# Patient Record
Sex: Female | Born: 1950 | Race: White | Hispanic: No | Smoking: Never smoker
Health system: Southern US, Community
[De-identification: ages and names within clinical notes are randomized; demographics above are authoritative.]

## PROBLEM LIST (undated history)

## (undated) DIAGNOSIS — M1990 Unspecified osteoarthritis, unspecified site: Secondary | ICD-10-CM

## (undated) DIAGNOSIS — F32A Depression, unspecified: Secondary | ICD-10-CM

## (undated) DIAGNOSIS — I10 Essential (primary) hypertension: Secondary | ICD-10-CM

## (undated) DIAGNOSIS — K2270 Barrett's esophagus without dysplasia: Secondary | ICD-10-CM

## (undated) DIAGNOSIS — E119 Type 2 diabetes mellitus without complications: Secondary | ICD-10-CM

## (undated) DIAGNOSIS — K219 Gastro-esophageal reflux disease without esophagitis: Secondary | ICD-10-CM

## (undated) DIAGNOSIS — F419 Anxiety disorder, unspecified: Secondary | ICD-10-CM

## (undated) HISTORY — PX: TONSILLECTOMY: SUR1361

## (undated) HISTORY — PX: ABDOMINAL HYSTERECTOMY: SHX81

## (undated) HISTORY — PX: KNEE SURGERY: SHX244

---

## 2021-07-02 ENCOUNTER — Other Ambulatory Visit: Payer: Self-pay | Admitting: Neurosurgery

## 2021-07-02 DIAGNOSIS — M8580 Other specified disorders of bone density and structure, unspecified site: Secondary | ICD-10-CM

## 2021-07-15 ENCOUNTER — Ambulatory Visit
Admission: RE | Admit: 2021-07-15 | Discharge: 2021-07-15 | Disposition: A | Discharge status: Home / Self Care | Payer: Medicare Other | Source: Ambulatory Visit | Attending: Neurosurgery | Admitting: Neurosurgery

## 2021-07-15 ENCOUNTER — Other Ambulatory Visit: Payer: Self-pay

## 2021-07-15 DIAGNOSIS — M8580 Other specified disorders of bone density and structure, unspecified site: Secondary | ICD-10-CM

## 2021-07-24 ENCOUNTER — Other Ambulatory Visit: Payer: Self-pay | Admitting: Neurosurgery

## 2021-08-15 NOTE — Progress Notes (Signed)
Surgical Instructions    Your procedure is scheduled on Tuesday September 13th.  Report to [facility] Main Entrance "A" at 8 A.M., then check in with the Admitting office.  Call this number if you have problems the morning of surgery:  [phone]   If you have any questions prior to your surgery date call [phone]: Open Monday-Friday 8am-4pm    Remember:  Do not eat after midnight the night before your surgery  You may drink clear liquids until 7am the morning of your surgery.   Clear liquids allowed are: Water, Non-Citrus Juices (without pulp), Carbonated Beverages, Clear Tea, Black Coffee Only, and Gatorade    Take these medicines the morning of surgery with A SIP OF WATER  escitalopram (LEXAPRO) 10 MG tablet omeprazole (PRILOSEC) 20 MG capsule rosuvastatin (CRESTOR) 5 MG tablet  IF NEEDED HYDROcodone-acetaminophen (NORCO/VICODIN) 5-325 MG tablet    As of today, STOP taking any Aspirin (unless otherwise instructed by your surgeon) Voltaren gel, Aleve, Naproxen, Ibuprofen, Motrin, Advil, Goody's, BC's, all herbal medications, fish oil, and all vitamins.   WHAT DO I DO ABOUT MY DIABETES MEDICATION?   Do not take oral diabetes medicines (METFORMIN) the morning of surgery.  HOW TO MANAGE YOUR DIABETES BEFORE AND AFTER SURGERY  Why is it important to control my blood sugar before and after surgery? Improving blood sugar levels before and after surgery helps healing and can limit problems. A way of improving blood sugar control is eating a healthy diet by:  Eating less sugar and carbohydrates  Increasing activity/exercise  Talking with your doctor about reaching your blood sugar goals High blood sugars (greater than 180 mg/dL) can raise your risk of infections and slow your recovery, so you will need to focus on controlling your diabetes during the weeks before surgery. Make sure that the doctor who takes care of your diabetes knows about your planned surgery including  the date and location.  How do I manage my blood sugar before surgery? Check your blood sugar at least 4 times a day, starting 2 days before surgery, to make sure that the level is not too high or low.  Check your blood sugar the morning of your surgery when you wake up and every 2 hours until you get to the Short Stay unit.  If your blood sugar is less than 70 mg/dL, you will need to treat for low blood sugar: Do not take insulin. Treat a low blood sugar (less than 70 mg/dL) with  cup of clear juice (cranberry or apple), 4 glucose tablets, OR glucose gel. Recheck blood sugar in 15 minutes after treatment (to make sure it is greater than 70 mg/dL). If your blood sugar is not greater than 70 mg/dL on recheck, call [phone] for further instructions. Report your blood sugar to the short stay nurse when you get to Short Stay.  If you are admitted to the hospital after surgery: Your blood sugar will be checked by the staff and you will probably be given insulin after surgery (instead of oral diabetes medicines) to make sure you have good blood sugar levels. The goal for blood sugar control after surgery is 80-180 mg/dL.        Do not wear jewelry or makeup Do not wear lotions, powders, perfumes, or deodorant. Do not shave 48 hours prior to surgery.   Do not bring valuables to the hospital. DO Not wear nail polish, gel polish, artificial nails, or any other type of covering on  natural nails including  finger and toenails. If patients have artificial nails, gel coating, etc. that need to be removed by a nail salon please have this removed prior to surgery or surgery may need to be canceled/delayed if the surgeon/ anesthesia feels like the patient is unable to be adequately monitored.             [facility] is not responsible for any belongings or valuables.  Do NOT Smoke (Tobacco/Vaping) or drink Alcohol 24 hours prior to your procedure If you use a CPAP at night, you may bring all  equipment for your overnight stay.   Contacts, glasses, dentures or bridgework may not be worn into surgery, please bring cases for these belongings   For patients admitted to the hospital, discharge time will be determined by your treatment team.   Patients discharged the day of surgery will not be allowed to drive home, and someone needs to stay with them for 24 hours.  ONLY 1 SUPPORT PERSON MAY BE PRESENT WHILE YOU ARE IN SURGERY. IF YOU ARE TO BE ADMITTED ONCE YOU ARE IN YOUR ROOM YOU WILL BE ALLOWED TWO (2) VISITORS.  Minor children may have two parents present. Special consideration for safety and communication needs will be reviewed on a case by case basis.  Special instructions:    Oral Hygiene is also important to reduce your risk of infection.  Remember - BRUSH YOUR TEETH THE MORNING OF SURGERY WITH YOUR REGULAR TOOTHPASTE   [facility]- Preparing For Surgery  Before surgery, you can play an important role. Because skin is not sterile, your skin needs to be as free of germs as possible. You can reduce the number of germs on your skin by washing with CHG (chlorahexidine gluconate) Soap before surgery.  CHG is an antiseptic cleaner which kills germs and bonds with the skin to continue killing germs even after washing.     Please do not use if you have an allergy to CHG or antibacterial soaps. If your skin becomes reddened/irritated stop using the CHG.  Do not shave (including legs and underarms) for at least 48 hours prior to first CHG shower. It is OK to shave your face.  Please follow these instructions carefully.     Shower the NIGHT BEFORE SURGERY and the MORNING OF SURGERY with CHG Soap.   If you chose to wash your hair, wash your hair first as usual with your normal shampoo. After you shampoo, rinse your hair and body thoroughly to remove the shampoo.  Then [employer] and genitals (private parts) with your normal soap and rinse thoroughly to remove soap.  After that Use CHG  Soap as you would any other liquid soap. You can apply CHG directly to the skin and wash gently with a scrungie or a clean washcloth.   Apply the CHG Soap to your body ONLY FROM THE NECK DOWN.  Do not use on open wounds or open sores. Avoid contact with your eyes, ears, mouth and genitals (private parts). Wash Face and genitals (private parts)  with your normal soap.   Wash thoroughly, paying special attention to the area where your surgery will be performed.  Thoroughly rinse your body with warm water from the neck down.  DO NOT shower/wash with your normal soap after using and rinsing off the CHG Soap.  Pat yourself dry with a CLEAN TOWEL.  Wear CLEAN PAJAMAS to bed the night before surgery  Place CLEAN SHEETS on your bed the night before your surgery  DO  NOT SLEEP WITH PETS.   Day of Surgery:  Take a shower with CHG soap. Wear Clean/Comfortable clothing the morning of surgery Do not apply any deodorants/lotions.   Remember to brush your teeth WITH YOUR REGULAR TOOTHPASTE.   Please read over the following fact sheets that you were given.

## 2021-08-18 ENCOUNTER — Encounter (HOSPITAL_COMMUNITY): Payer: Self-pay

## 2021-08-18 ENCOUNTER — Encounter (HOSPITAL_COMMUNITY)
Admission: RE | Admit: 2021-08-18 | Discharge: 2021-08-18 | Disposition: A | Discharge status: Home / Self Care | Payer: Medicare Other | Source: Ambulatory Visit | Attending: Neurosurgery | Admitting: Neurosurgery

## 2021-08-18 ENCOUNTER — Other Ambulatory Visit: Payer: Self-pay

## 2021-08-18 DIAGNOSIS — Z01818 Encounter for other preprocedural examination: Secondary | ICD-10-CM | POA: Diagnosis present

## 2021-08-18 HISTORY — DX: Unspecified osteoarthritis, unspecified site: M19.90

## 2021-08-18 HISTORY — DX: Essential (primary) hypertension: I10

## 2021-08-18 HISTORY — DX: Gastro-esophageal reflux disease without esophagitis: K21.9

## 2021-08-18 HISTORY — DX: Hemochromatosis, unspecified: E83.119

## 2021-08-18 HISTORY — DX: Barrett's esophagus without dysplasia: K22.70

## 2021-08-18 HISTORY — DX: Anxiety disorder, unspecified: F41.9

## 2021-08-18 HISTORY — DX: Type 2 diabetes mellitus without complications: E11.9

## 2021-08-18 HISTORY — DX: Depression, unspecified: F32.A

## 2021-08-18 LAB — BASIC METABOLIC PANEL
Anion gap: 9 (ref 5–15)
BUN: 12 mg/dL (ref 8–23)
CO2: 28 mmol/L (ref 22–32)
Calcium: 9.6 mg/dL (ref 8.9–10.3)
Chloride: 102 mmol/L (ref 98–111)
Creatinine, Ser: 0.85 mg/dL (ref 0.44–1.00)
GFR, Estimated: >60 mL/min (ref >60)
Glucose, Bld: 105 mg/dL — ABNORMAL HIGH (ref 70–99)
Potassium: 4.1 mmol/L (ref 3.5–5.1)
Sodium: 139 mmol/L (ref 135–145)

## 2021-08-18 LAB — GLUCOSE, CAPILLARY: Glucose-Capillary: 113 mg/dL — ABNORMAL HIGH (ref 70–99)

## 2021-08-18 LAB — CBC
HCT: 35.2 % — ABNORMAL LOW (ref 36.0–46.0)
Hemoglobin: 11.2 g/dL — ABNORMAL LOW (ref 12.0–15.0)
MCH: 26.2 pg (ref 26.0–34.0)
MCHC: 31.8 g/dL (ref 30.0–36.0)
MCV: 82.2 fL (ref 80.0–100.0)
Platelets: 198 10*3/uL (ref 150–400)
RBC: 4.28 MIL/uL (ref 3.87–5.11)
RDW: 15.4 % (ref 11.5–15.5)
WBC: 7.3 10*3/uL (ref 4.0–10.5)
nRBC: 0 % (ref 0.0–0.2)

## 2021-08-18 LAB — TYPE AND SCREEN
ABO/RH(D): O POS
Antibody Screen: NEGATIVE

## 2021-08-18 LAB — HEMOGLOBIN A1C
Hgb A1c MFr Bld: 6 % — ABNORMAL HIGH (ref 4.8–5.6)
Mean Plasma Glucose: 125.5 mg/dL

## 2021-08-18 LAB — SURGICAL PCR SCREEN
MRSA, PCR: NEGATIVE
Staphylococcus aureus: NEGATIVE

## 2021-08-18 NOTE — Progress Notes (Signed)
PCP - Dr. Jeannett Senior Cardiologist - Dr. Maud Deed not seen in over 15 years. Pt was consulted at one point for cardiac testing d/t abnormal EKG. Testing was benign and pt has not had any recurrent cardiac issues.    Chest x-ray - n/a EKG - 08/18/21 Stress Test - 15+ years ago ECHO - 15+ years ago Cardiac Cath - denies  Sleep Study - denies CPAP - denies  Fasting Blood Sugar - 90-100 Checks Blood Sugar 2 times a week CBG at PAT 113 Last A1C reported 04/2021-5.7 Will collect A1C today.  Blood Thinner Instructions: n/a Aspirin Instructions: n/a  ERAS Protcol -Pt to stop clears by 0700 DOS. PRE-SURGERY Ensure or G2- not indicated.  COVID TEST- Pt to go for covid testing on 09/05/21 at [facility]. Pt given map, directions, instructions and requisition form.   Anesthesia review: Yes, pending PCP last office note.  Patient denies shortness of breath, fever, cough and chest pain at PAT appointment   All instructions explained to the patient, with a verbal understanding of the material. Patient agrees to go over the instructions while at home for a better understanding. Patient also instructed to self quarantine after being tested for COVID-19. The opportunity to ask questions was provided.

## 2021-08-20 NOTE — Progress Notes (Signed)
Anesthesia Chart Review:  Case: [id number] Date/Time: 09/09/21 0945   Procedures:      Right Lumbar 3-4 Lumbar 4-5 Anterolateral lumbar interbody fusion with percutaneous pedicle screw fixation (Right)     LUMBAR PERCUTANEOUS PEDICLE SCREW 2 LEVEL     APPLICATION OF PULSE     APPLICATION OF CELL SAVER   Anesthesia type: General   Pre-op diagnosis: Lumbar foraminal stenosis   Location: [facility] OR ROOM 21 / [facility] OR   Surgeons: Maeola Harman, MD       DISCUSSION: Patient is a 70 year old female scheduled for the above procedure.  History includes never smoker, HTN, DM2, GERD, hemochromatosis.   Dr. Jeannett Senior signed a note of surgical clearance classifying her as "low risk".  She included office visit from 06/10/2021 for follow-up hypertension, DM2, hypercholesterolemia.  A1c down to 5.8% so metformin dose reduced.  Lisinopril-HCTZ continued for hypertension.  Rosuvastatin continued at 5 mg daily for hyperlipidemia.  PAT RN requested records from [facility] (formerly [facility] Records were primarily from an April 2017 3-day admission for acute encephalopathy without clear etiology documented, although treated for low vitamin B-12 level and mild renal insufficiency with Creatinine of 1.2. Confusion and gait abnormality resolved. Unremarkable brain MRI 04/15/2016.  Neurology was consulted.  EEG showed an isolated event of left temporal asymmetric theta slowing to correlate with the possibility of cortical structural and/or physiological brain damage and/or dysfunction that could implicate some mild decrease seizure threshold, although no evidence epileptiform activity and no electrographic seizure noted. Overnight oximetry for 04/15/2016 was negative on room air. She was discharged home with out-patient primary care and neurology follow-up. No old EKG or cardiac testing received (denied any recent testing).  She denied fever, cough, SOB, chest pain at PAT RN visit. Her  preoperative COVID-19 test is scheduled for 09/05/21. Her PAT visit occurred > 14 days out from her surgery, so she will need a T&S on arrival. Anesthesia team to evaluate on the day of surgery.     VS: BP (!) 142/76   Pulse 85   Temp 36.7 C (Oral)   Resp 18   Ht 5\' 6"  (1.676 m)   Wt 70.8 kg   SpO2 99%   BMI 25.18 kg/m    PROVIDERS: , MD is PCP (GYN)   LABS: Labs reviewed: Acceptable for surgery. (all labs ordered are listed, but only abnormal results are displayed)  Labs Reviewed  GLUCOSE, CAPILLARY - Abnormal; Notable for the following components:      Result Value   Glucose-Capillary 113 (*)    All other components within normal limits  HEMOGLOBIN A1C - Abnormal; Notable for the following components:   Hgb A1c MFr Bld 6.0 (*)    All other components within normal limits  BASIC METABOLIC PANEL - Abnormal; Notable for the following components:   Glucose, Bld 105 (*)    All other components within normal limits  CBC - Abnormal; Notable for the following components:   Hemoglobin 11.2 (*)    HCT 35.2 (*)    All other components within normal limits  SURGICAL PCR SCREEN  TYPE AND SCREEN     EKG: 08/18/21: Normal sinus rhythm Septal infarct , age undetermined Abnormal ECG Confirmed by 08/20/21 (Hillis Range) on 08/18/2021 8:31:26 PM   CV: Reported normal "cardiac testing" greater than 15 years ago for evaluation of an abnormal EKG per cardiologist Dr. 08/20/2021 in [place], [place].   Past Medical History:  Diagnosis Date   Anxiety    Arthritis    Barrett's syndrome    Depression    Diabetes mellitus without complication (HCC)    GERD (gastroesophageal reflux disease)    Hemochromatosis    Hypertension     Past Surgical History:  Procedure Laterality Date   ABDOMINAL HYSTERECTOMY     KNEE SURGERY Right    TONSILLECTOMY      MEDICATIONS:  Alpha-Lipoic Acid 200 MG TABS   ALPRAZolam (XANAX) 0.5 MG tablet   Cholecalciferol (VITAMIN D) 50 MCG (2000  UT) CAPS   diclofenac Sodium (VOLTAREN) 1 % GEL   escitalopram (LEXAPRO) 10 MG tablet   HYDROcodone-acetaminophen (NORCO/VICODIN) 5-325 MG tablet   ibuprofen (ADVIL) 800 MG tablet   lisinopril-hydrochlorothiazide (ZESTORETIC) 20-12.5 MG tablet   Magnesium 250 MG TABS   metFORMIN (GLUCOPHAGE-XR) 500 MG 24 hr tablet   Omega-3 Fatty Acids (FISH OIL ULTRA) 1400 MG CAPS   omeprazole (PRILOSEC) 20 MG capsule   rosuvastatin (CRESTOR) 5 MG tablet   vitamin B-12 (CYANOCOBALAMIN) 1000 MCG tablet   No current facility-administered medications for this encounter.    Shonna Chock, PA-C Surgical Short Stay/Anesthesiology [facility] Phone [phone] [facility] Phone [phone] 08/21/2021 9:32 AM

## 2021-08-21 NOTE — Anesthesia Preprocedure Evaluation (Addendum)
Anesthesia Evaluation  Patient identified by MRN, date of birth, ID band Patient awake    Reviewed: Allergy & Precautions, NPO status , Patient's Chart, lab work & pertinent test results  Airway Mallampati: II  TM Distance: >3 FB Neck ROM: Full    Dental no notable dental hx.    Pulmonary neg pulmonary ROS,    Pulmonary exam normal breath sounds clear to auscultation       Cardiovascular hypertension, Pt. on medications and Pt. on home beta blockers Normal cardiovascular exam Rhythm:Regular Rate:Normal  ECG: NSR, rate 72   Neuro/Psych PSYCHIATRIC DISORDERS Anxiety Depression negative neurological ROS     GI/Hepatic Neg liver ROS, GERD  Medicated and Controlled,  Endo/Other  diabetes, Oral Hypoglycemic Agents  Renal/GU negative Renal ROS     Musculoskeletal  (+) Arthritis ,   Abdominal   Peds  Hematology HLD   Anesthesia Other Findings Lumbar foraminal stenosis  Reproductive/Obstetrics                           Anesthesia Physical Anesthesia Plan  ASA: 3  Anesthesia Plan: General   Post-op Pain Management:    Induction: Intravenous  PONV Risk Score and Plan: 3 and Dexamethasone, Midazolam, Ondansetron and Treatment may vary due to age or medical condition  Airway Management Planned: Oral ETT  Additional Equipment:   Intra-op Plan:   Post-operative Plan: Extubation in OR  Informed Consent: I have reviewed the patients History and Physical, chart, labs and discussed the procedure including the risks, benefits and alternatives for the proposed anesthesia with the patient or authorized representative who has indicated his/her understanding and acceptance.     Dental advisory given  Plan Discussed with: CRNA  Anesthesia Plan Comments: (Reviewed PAT note written 08/21/2021 by Shonna Chock, PA-C. [facility])      Anesthesia Quick Evaluation

## 2021-09-05 ENCOUNTER — Other Ambulatory Visit: Payer: Self-pay | Admitting: Neurosurgery

## 2021-09-06 LAB — SARS CORONAVIRUS 2 (TAT 6-24 HRS): SARS Coronavirus 2: NEGATIVE

## 2021-09-09 ENCOUNTER — Inpatient Hospital Stay (HOSPITAL_COMMUNITY): Payer: Medicare Other | Admitting: Physician Assistant

## 2021-09-09 ENCOUNTER — Inpatient Hospital Stay (HOSPITAL_COMMUNITY)
Admission: RE | Admit: 2021-09-09 | Discharge: 2021-09-10 | DRG: 458 | Disposition: A | Discharge status: Home Health | Payer: Medicare Other | Attending: Neurosurgery | Admitting: Neurosurgery

## 2021-09-09 ENCOUNTER — Inpatient Hospital Stay (HOSPITAL_COMMUNITY): Payer: Medicare Other

## 2021-09-09 ENCOUNTER — Inpatient Hospital Stay (HOSPITAL_COMMUNITY): Payer: Medicare Other | Admitting: Certified Registered"

## 2021-09-09 ENCOUNTER — Encounter (HOSPITAL_COMMUNITY): Payer: Self-pay | Admitting: Neurosurgery

## 2021-09-09 ENCOUNTER — Other Ambulatory Visit: Payer: Self-pay

## 2021-09-09 ENCOUNTER — Encounter (HOSPITAL_COMMUNITY)
Admission: RE | Disposition: A | Discharge status: Home Health | Payer: Self-pay | Source: Home / Self Care | Attending: Neurosurgery

## 2021-09-09 DIAGNOSIS — M4126 Other idiopathic scoliosis, lumbar region: Secondary | ICD-10-CM | POA: Diagnosis present

## 2021-09-09 DIAGNOSIS — M5459 Other low back pain: Secondary | ICD-10-CM | POA: Diagnosis present

## 2021-09-09 DIAGNOSIS — K219 Gastro-esophageal reflux disease without esophagitis: Secondary | ICD-10-CM | POA: Diagnosis present

## 2021-09-09 DIAGNOSIS — M810 Age-related osteoporosis without current pathological fracture: Secondary | ICD-10-CM | POA: Diagnosis present

## 2021-09-09 DIAGNOSIS — M5416 Radiculopathy, lumbar region: Secondary | ICD-10-CM | POA: Diagnosis present

## 2021-09-09 DIAGNOSIS — F32A Depression, unspecified: Secondary | ICD-10-CM | POA: Diagnosis present

## 2021-09-09 DIAGNOSIS — Z79899 Other long term (current) drug therapy: Secondary | ICD-10-CM | POA: Diagnosis not present

## 2021-09-09 DIAGNOSIS — M4316 Spondylolisthesis, lumbar region: Secondary | ICD-10-CM | POA: Diagnosis present

## 2021-09-09 DIAGNOSIS — I10 Essential (primary) hypertension: Secondary | ICD-10-CM | POA: Diagnosis present

## 2021-09-09 DIAGNOSIS — Z7984 Long term (current) use of oral hypoglycemic drugs: Secondary | ICD-10-CM | POA: Diagnosis not present

## 2021-09-09 DIAGNOSIS — F419 Anxiety disorder, unspecified: Secondary | ICD-10-CM | POA: Diagnosis present

## 2021-09-09 DIAGNOSIS — Z419 Encounter for procedure for purposes other than remedying health state, unspecified: Secondary | ICD-10-CM

## 2021-09-09 DIAGNOSIS — M48062 Spinal stenosis, lumbar region with neurogenic claudication: Secondary | ICD-10-CM | POA: Diagnosis present

## 2021-09-09 DIAGNOSIS — Z20822 Contact with and (suspected) exposure to covid-19: Secondary | ICD-10-CM | POA: Diagnosis present

## 2021-09-09 DIAGNOSIS — E119 Type 2 diabetes mellitus without complications: Secondary | ICD-10-CM | POA: Diagnosis present

## 2021-09-09 DIAGNOSIS — M419 Scoliosis, unspecified: Secondary | ICD-10-CM | POA: Diagnosis present

## 2021-09-09 HISTORY — PX: ANTERIOR LAT LUMBAR FUSION: SHX1168

## 2021-09-09 HISTORY — PX: LUMBAR PERCUTANEOUS PEDICLE SCREW 2 LEVEL: SHX5561

## 2021-09-09 LAB — TYPE AND SCREEN
ABO/RH(D): O POS
Antibody Screen: NEGATIVE

## 2021-09-09 LAB — GLUCOSE, CAPILLARY
Glucose-Capillary: 106 mg/dL — ABNORMAL HIGH (ref 70–99)
Glucose-Capillary: 137 mg/dL — ABNORMAL HIGH (ref 70–99)
Glucose-Capillary: 170 mg/dL — ABNORMAL HIGH (ref 70–99)

## 2021-09-09 SURGERY — ANTERIOR LATERAL LUMBAR FUSION 2 LEVELS
Anesthesia: General | Laterality: Right

## 2021-09-09 MED ORDER — ACETAMINOPHEN 650 MG RE SUPP: 650.0000 mg | RECTAL | Status: DC | PRN

## 2021-09-09 MED ORDER — FENTANYL CITRATE (PF) 250 MCG/5ML IJ SOLN
INTRAMUSCULAR | Status: AC
  Filled 2021-09-09: qty 5

## 2021-09-09 MED ORDER — ONDANSETRON HCL 4 MG/2ML IJ SOLN
INTRAMUSCULAR | Status: DC | PRN
  Administered 2021-09-09: 4 mg via INTRAVENOUS

## 2021-09-09 MED ORDER — SUCCINYLCHOLINE CHLORIDE 200 MG/10ML IV SOSY
PREFILLED_SYRINGE | INTRAVENOUS | Status: DC | PRN
Start: 2021-09-09 — End: 2021-09-09
  Administered 2021-09-09: 80 mg via INTRAVENOUS

## 2021-09-09 MED ORDER — PROPOFOL 10 MG/ML IV BOLUS
INTRAVENOUS | Status: AC
  Filled 2021-09-09: qty 20

## 2021-09-09 MED ORDER — BUPIVACAINE LIPOSOME 1.3 % IJ SUSP
INTRAMUSCULAR | Status: DC | PRN
  Administered 2021-09-09: 20 mL

## 2021-09-09 MED ORDER — HYDROCODONE-ACETAMINOPHEN 5-325 MG PO TABS
1.0000 | ORAL_TABLET | Freq: Four times a day (QID) | ORAL | Status: DC | PRN
Start: 2021-09-09 — End: 2021-09-09

## 2021-09-09 MED ORDER — METHOCARBAMOL 500 MG PO TABS
500.0000 mg | ORAL_TABLET | Freq: Four times a day (QID) | ORAL | Status: DC | PRN
  Administered 2021-09-09 – 2021-09-10 (×2): 500 mg via ORAL
  Filled 2021-09-09 (×2): qty 1

## 2021-09-09 MED ORDER — SODIUM CHLORIDE 0.9% FLUSH: 3.0000 mL | INTRAVENOUS | Status: DC | PRN

## 2021-09-09 MED ORDER — CEFAZOLIN SODIUM-DEXTROSE 2-4 GM/100ML-% IV SOLN
INTRAVENOUS | Status: AC
  Filled 2021-09-09: qty 100

## 2021-09-09 MED ORDER — ACETAMINOPHEN 10 MG/ML IV SOLN
INTRAVENOUS | Status: AC
  Filled 2021-09-09: qty 100

## 2021-09-09 MED ORDER — HYDROMORPHONE HCL 1 MG/ML IJ SOLN
INTRAMUSCULAR | Status: AC
  Filled 2021-09-09: qty 0.5

## 2021-09-09 MED ORDER — MENTHOL 3 MG MT LOZG: 1.0000 | LOZENGE | OROMUCOSAL | Status: DC | PRN

## 2021-09-09 MED ORDER — ROCURONIUM BROMIDE 10 MG/ML (PF) SYRINGE
PREFILLED_SYRINGE | INTRAVENOUS | Status: AC
  Filled 2021-09-09: qty 10

## 2021-09-09 MED ORDER — ORAL CARE MOUTH RINSE: 15.0000 mL | Freq: Once | OROMUCOSAL | Status: AC

## 2021-09-09 MED ORDER — LIDOCAINE-EPINEPHRINE 1 %-1:100000 IJ SOLN
INTRAMUSCULAR | Status: DC | PRN
  Administered 2021-09-09: 9 mL
  Administered 2021-09-09: 3.5 mL

## 2021-09-09 MED ORDER — CEFAZOLIN SODIUM-DEXTROSE 2-4 GM/100ML-% IV SOLN
2.0000 g | INTRAVENOUS | Status: AC
  Administered 2021-09-09 (×2): 2 g via INTRAVENOUS

## 2021-09-09 MED ORDER — AMISULPRIDE (ANTIEMETIC) 5 MG/2ML IV SOLN: 10.0000 mg | Freq: Once | INTRAVENOUS | Status: DC | PRN

## 2021-09-09 MED ORDER — 0.9 % SODIUM CHLORIDE (POUR BTL) OPTIME
TOPICAL | Status: DC | PRN
  Administered 2021-09-09: 1000 mL

## 2021-09-09 MED ORDER — CEFAZOLIN SODIUM 1 G IJ SOLR
INTRAMUSCULAR | Status: AC
  Filled 2021-09-09: qty 20

## 2021-09-09 MED ORDER — THROMBIN 5000 UNITS EX SOLR
OROMUCOSAL | Status: DC | PRN
  Administered 2021-09-09: 5 mL via TOPICAL

## 2021-09-09 MED ORDER — FENTANYL CITRATE (PF) 100 MCG/2ML IJ SOLN
INTRAMUSCULAR | Status: DC | PRN
  Administered 2021-09-09: 50 ug via INTRAVENOUS
  Administered 2021-09-09 (×2): 25 ug via INTRAVENOUS
  Administered 2021-09-09: 100 ug via INTRAVENOUS
  Administered 2021-09-09 (×3): 50 ug via INTRAVENOUS

## 2021-09-09 MED ORDER — CHLORHEXIDINE GLUCONATE CLOTH 2 % EX PADS: 6.0000 | MEDICATED_PAD | Freq: Once | CUTANEOUS | Status: DC

## 2021-09-09 MED ORDER — PHENYLEPHRINE HCL-NACL 20-0.9 MG/250ML-% IV SOLN
INTRAVENOUS | Status: DC | PRN
  Administered 2021-09-09: 20 ug/min via INTRAVENOUS

## 2021-09-09 MED ORDER — FENTANYL CITRATE (PF) 100 MCG/2ML IJ SOLN
25.0000 ug | INTRAMUSCULAR | Status: DC | PRN
  Administered 2021-09-09: 25 ug via INTRAVENOUS
  Administered 2021-09-09: 50 ug via INTRAVENOUS

## 2021-09-09 MED ORDER — SODIUM CHLORIDE 0.9 % IV SOLN: 250.0000 mL | INTRAVENOUS | Status: DC

## 2021-09-09 MED ORDER — VITAMIN D3 25 MCG (1000 UNIT) PO TABS
2000.0000 [IU] | ORAL_TABLET | Freq: Every day | ORAL | Status: DC
  Administered 2021-09-09: 2000 [IU] via ORAL
  Filled 2021-09-09 (×3): qty 2

## 2021-09-09 MED ORDER — LISINOPRIL 20 MG PO TABS: 20.0000 mg | ORAL_TABLET | Freq: Every day | ORAL | Status: DC

## 2021-09-09 MED ORDER — ONDANSETRON HCL 4 MG/2ML IJ SOLN: 4.0000 mg | Freq: Once | INTRAMUSCULAR | Status: DC | PRN

## 2021-09-09 MED ORDER — CHLORHEXIDINE GLUCONATE 0.12 % MT SOLN: 15.0000 mL | Freq: Once | OROMUCOSAL | Status: AC

## 2021-09-09 MED ORDER — BISACODYL 10 MG RE SUPP: 10.0000 mg | Freq: Every day | RECTAL | Status: DC | PRN

## 2021-09-09 MED ORDER — PROPOFOL 10 MG/ML IV BOLUS
INTRAVENOUS | Status: DC | PRN
  Administered 2021-09-09: 200 mg via INTRAVENOUS

## 2021-09-09 MED ORDER — PHENOL 1.4 % MT LIQD: 1.0000 | OROMUCOSAL | Status: DC | PRN

## 2021-09-09 MED ORDER — DEXAMETHASONE SODIUM PHOSPHATE 10 MG/ML IJ SOLN
INTRAMUSCULAR | Status: AC
  Filled 2021-09-09: qty 1

## 2021-09-09 MED ORDER — CHLORHEXIDINE GLUCONATE 0.12 % MT SOLN
OROMUCOSAL | Status: AC
  Administered 2021-09-09: 15 mL via OROMUCOSAL
  Filled 2021-09-09: qty 15

## 2021-09-09 MED ORDER — KETAMINE HCL 10 MG/ML IJ SOLN
INTRAMUSCULAR | Status: DC | PRN
Start: 2021-09-09 — End: 2021-09-09
  Administered 2021-09-09 (×5): 10 mg via INTRAVENOUS

## 2021-09-09 MED ORDER — ALPRAZOLAM 0.5 MG PO TABS
0.5000 mg | ORAL_TABLET | Freq: Every day | ORAL | Status: DC
  Administered 2021-09-09: 0.5 mg via ORAL
  Filled 2021-09-09: qty 1

## 2021-09-09 MED ORDER — HYDROMORPHONE HCL 1 MG/ML IJ SOLN
0.5000 mg | INTRAMUSCULAR | Status: DC | PRN
  Administered 2021-09-09: 0.5 mg via INTRAVENOUS

## 2021-09-09 MED ORDER — ALPHA-LIPOIC ACID 200 MG PO TABS: 200.0000 mg | ORAL_TABLET | Freq: Every day | ORAL | Status: DC

## 2021-09-09 MED ORDER — KCL IN DEXTROSE-NACL 20-5-0.45 MEQ/L-%-% IV SOLN: INTRAVENOUS | Status: DC

## 2021-09-09 MED ORDER — LIDOCAINE 2% (20 MG/ML) 5 ML SYRINGE
INTRAMUSCULAR | Status: DC | PRN
  Administered 2021-09-09: 60 mg via INTRAVENOUS

## 2021-09-09 MED ORDER — DICLOFENAC SODIUM 1 % EX GEL
2.0000 g | Freq: Every day | CUTANEOUS | Status: DC | PRN
  Filled 2021-09-09: qty 100

## 2021-09-09 MED ORDER — MIDAZOLAM HCL 2 MG/2ML IJ SOLN
INTRAMUSCULAR | Status: AC
  Filled 2021-09-09: qty 2

## 2021-09-09 MED ORDER — ONDANSETRON HCL 4 MG PO TABS: 4.0000 mg | ORAL_TABLET | Freq: Four times a day (QID) | ORAL | Status: DC | PRN

## 2021-09-09 MED ORDER — EPHEDRINE SULFATE-NACL 50-0.9 MG/10ML-% IV SOSY
PREFILLED_SYRINGE | INTRAVENOUS | Status: DC | PRN
  Administered 2021-09-09: 5 mg via INTRAVENOUS

## 2021-09-09 MED ORDER — VITAMIN B-12 1000 MCG PO TABS
2000.0000 ug | ORAL_TABLET | Freq: Every day | ORAL | Status: DC
  Administered 2021-09-09: 2000 ug via ORAL
  Filled 2021-09-09: qty 2

## 2021-09-09 MED ORDER — PHENYLEPHRINE 40 MCG/ML (10ML) SYRINGE FOR IV PUSH (FOR BLOOD PRESSURE SUPPORT)
PREFILLED_SYRINGE | INTRAVENOUS | Status: DC | PRN
  Administered 2021-09-09 (×2): 80 ug via INTRAVENOUS

## 2021-09-09 MED ORDER — PANTOPRAZOLE SODIUM 40 MG PO TBEC: 40.0000 mg | DELAYED_RELEASE_TABLET | Freq: Every day | ORAL | Status: DC

## 2021-09-09 MED ORDER — HYDROCODONE-ACETAMINOPHEN 5-325 MG PO TABS
1.0000 | ORAL_TABLET | ORAL | Status: DC | PRN
  Administered 2021-09-09 – 2021-09-10 (×4): 2 via ORAL
  Filled 2021-09-09 (×4): qty 2

## 2021-09-09 MED ORDER — LIDOCAINE-EPINEPHRINE 1 %-1:100000 IJ SOLN
INTRAMUSCULAR | Status: AC
  Filled 2021-09-09: qty 1

## 2021-09-09 MED ORDER — ONDANSETRON HCL 4 MG/2ML IJ SOLN
INTRAMUSCULAR | Status: AC
  Filled 2021-09-09: qty 2

## 2021-09-09 MED ORDER — METHOCARBAMOL 1000 MG/10ML IJ SOLN: 500.0000 mg | Freq: Four times a day (QID) | INTRAVENOUS | Status: DC | PRN

## 2021-09-09 MED ORDER — ACETAMINOPHEN 325 MG PO TABS: 650.0000 mg | ORAL_TABLET | ORAL | Status: DC | PRN

## 2021-09-09 MED ORDER — ALUM & MAG HYDROXIDE-SIMETH 200-200-20 MG/5ML PO SUSP: 30.0000 mL | Freq: Four times a day (QID) | ORAL | Status: DC | PRN

## 2021-09-09 MED ORDER — PANTOPRAZOLE SODIUM 40 MG IV SOLR: 40.0000 mg | Freq: Every day | INTRAVENOUS | Status: DC

## 2021-09-09 MED ORDER — DOCUSATE SODIUM 100 MG PO CAPS
100.0000 mg | ORAL_CAPSULE | Freq: Two times a day (BID) | ORAL | Status: DC
  Administered 2021-09-09: 100 mg via ORAL
  Filled 2021-09-09: qty 1

## 2021-09-09 MED ORDER — SODIUM CHLORIDE 0.9% FLUSH
3.0000 mL | Freq: Two times a day (BID) | INTRAVENOUS | Status: DC
  Administered 2021-09-09: 3 mL via INTRAVENOUS

## 2021-09-09 MED ORDER — ESCITALOPRAM OXALATE 5 MG PO TABS
15.0000 mg | ORAL_TABLET | Freq: Every day | ORAL | Status: DC
  Filled 2021-09-09: qty 1

## 2021-09-09 MED ORDER — LACTATED RINGERS IV SOLN: INTRAVENOUS | Status: DC | PRN

## 2021-09-09 MED ORDER — ZOLPIDEM TARTRATE 5 MG PO TABS: 5.0000 mg | ORAL_TABLET | Freq: Every evening | ORAL | Status: DC | PRN

## 2021-09-09 MED ORDER — MAGNESIUM OXIDE -MG SUPPLEMENT 400 (240 MG) MG PO TABS
400.0000 mg | ORAL_TABLET | Freq: Every day | ORAL | Status: DC
  Administered 2021-09-09: 400 mg via ORAL
  Filled 2021-09-09: qty 1

## 2021-09-09 MED ORDER — ROSUVASTATIN CALCIUM 5 MG PO TABS: 5.0000 mg | ORAL_TABLET | Freq: Every day | ORAL | Status: DC

## 2021-09-09 MED ORDER — BUPIVACAINE HCL (PF) 0.5 % IJ SOLN
INTRAMUSCULAR | Status: AC
  Filled 2021-09-09: qty 30

## 2021-09-09 MED ORDER — BUPIVACAINE HCL (PF) 0.5 % IJ SOLN
INTRAMUSCULAR | Status: DC | PRN
  Administered 2021-09-09: 3.5 mL
  Administered 2021-09-09: 9 mL

## 2021-09-09 MED ORDER — BUPIVACAINE LIPOSOME 1.3 % IJ SUSP
INTRAMUSCULAR | Status: AC
  Filled 2021-09-09: qty 20

## 2021-09-09 MED ORDER — POLYETHYLENE GLYCOL 3350 17 G PO PACK: 17.0000 g | PACK | Freq: Every day | ORAL | Status: DC | PRN

## 2021-09-09 MED ORDER — ONDANSETRON HCL 4 MG/2ML IJ SOLN
4.0000 mg | Freq: Four times a day (QID) | INTRAMUSCULAR | Status: DC | PRN
  Administered 2021-09-09: 4 mg via INTRAVENOUS
  Filled 2021-09-09: qty 2

## 2021-09-09 MED ORDER — MIDAZOLAM HCL 5 MG/5ML IJ SOLN
INTRAMUSCULAR | Status: DC | PRN
Start: 2021-09-09 — End: 2021-09-09
  Administered 2021-09-09: 2 mg via INTRAVENOUS

## 2021-09-09 MED ORDER — DEXAMETHASONE SODIUM PHOSPHATE 10 MG/ML IJ SOLN
INTRAMUSCULAR | Status: DC | PRN
  Administered 2021-09-09: 10 mg via INTRAVENOUS

## 2021-09-09 MED ORDER — CEFAZOLIN SODIUM-DEXTROSE 2-4 GM/100ML-% IV SOLN
2.0000 g | Freq: Three times a day (TID) | INTRAVENOUS | Status: AC
  Administered 2021-09-09 – 2021-09-10 (×2): 2 g via INTRAVENOUS
  Filled 2021-09-09 (×2): qty 100

## 2021-09-09 MED ORDER — THROMBIN 5000 UNITS EX SOLR
CUTANEOUS | Status: AC
  Filled 2021-09-09: qty 5000

## 2021-09-09 MED ORDER — FENTANYL CITRATE (PF) 100 MCG/2ML IJ SOLN
INTRAMUSCULAR | Status: AC
  Filled 2021-09-09: qty 2

## 2021-09-09 MED ORDER — LISINOPRIL-HYDROCHLOROTHIAZIDE 20-12.5 MG PO TABS: 1.0000 | ORAL_TABLET | Freq: Every day | ORAL | Status: DC

## 2021-09-09 MED ORDER — LACTATED RINGERS IV SOLN: INTRAVENOUS | Status: DC

## 2021-09-09 MED ORDER — FLEET ENEMA 7-19 GM/118ML RE ENEM: 1.0000 | ENEMA | Freq: Once | RECTAL | Status: DC | PRN

## 2021-09-09 MED ORDER — METFORMIN HCL ER 500 MG PO TB24
500.0000 mg | ORAL_TABLET | Freq: Two times a day (BID) | ORAL | Status: DC
  Administered 2021-09-09 – 2021-09-10 (×2): 500 mg via ORAL
  Filled 2021-09-09 (×2): qty 1

## 2021-09-09 MED ORDER — ACETAMINOPHEN 10 MG/ML IV SOLN
1000.0000 mg | Freq: Once | INTRAVENOUS | Status: DC | PRN
  Administered 2021-09-09: 1000 mg via INTRAVENOUS

## 2021-09-09 MED ORDER — INSULIN ASPART 100 UNIT/ML IJ SOLN: 0.0000 [IU] | Freq: Three times a day (TID) | INTRAMUSCULAR | Status: DC

## 2021-09-09 MED ORDER — OMEGA-3-ACID ETHYL ESTERS 1 G PO CAPS
3000.0000 mg | ORAL_CAPSULE | Freq: Every day | ORAL | Status: DC
  Administered 2021-09-09: 3000 mg via ORAL
  Filled 2021-09-09: qty 3

## 2021-09-09 MED ORDER — KETAMINE HCL 50 MG/5ML IJ SOSY
PREFILLED_SYRINGE | INTRAMUSCULAR | Status: AC
  Filled 2021-09-09: qty 5

## 2021-09-09 MED ORDER — LIDOCAINE 2% (20 MG/ML) 5 ML SYRINGE
INTRAMUSCULAR | Status: AC
  Filled 2021-09-09: qty 5

## 2021-09-09 MED ORDER — HYDROCHLOROTHIAZIDE 12.5 MG PO CAPS: 12.5000 mg | ORAL_CAPSULE | Freq: Every day | ORAL | Status: DC

## 2021-09-09 SURGICAL SUPPLY — 77 items
BAG COUNTER SPONGE SURGICOUNT (BAG) ×6 IMPLANT
BLADE CLIPPER SURG (BLADE) IMPLANT
CAGE MODULUS XLW 10X22X60 - 10 (Cage) ×3 IMPLANT
CARTRIDGE OIL MAESTRO DRILL (MISCELLANEOUS) ×2 IMPLANT
CLIP PULSE STIMULATION (NEUROSURGERY SUPPLIES) ×6 IMPLANT
CNTNR URN SCR LID CUP LEK RST (MISCELLANEOUS) ×2 IMPLANT
CONT SPEC 4OZ STRL OR WHT (MISCELLANEOUS) ×1
COVER BACK TABLE 60X90IN (DRAPES) ×3 IMPLANT
DECANTER SPIKE VIAL GLASS SM (MISCELLANEOUS) ×6 IMPLANT
DERMABOND ADVANCED (GAUZE/BANDAGES/DRESSINGS) ×3
DERMABOND ADVANCED .7 DNX12 (GAUZE/BANDAGES/DRESSINGS) ×6 IMPLANT
DIFFUSER DRILL AIR PNEUMATIC (MISCELLANEOUS) IMPLANT
DRAPE C-ARM 42X72 X-RAY (DRAPES) ×6 IMPLANT
DRAPE C-ARMOR (DRAPES) ×6 IMPLANT
DRAPE LAPAROTOMY 100X72X124 (DRAPES) ×6 IMPLANT
DRAPE PULSE TABLE ARRAY (DRAPES) IMPLANT
DRAPE SURG 17X23 STRL (DRAPES) ×3 IMPLANT
DRSG OPSITE POSTOP 4X6 (GAUZE/BANDAGES/DRESSINGS) ×6 IMPLANT
DRSG OPSITE POSTOP 4X8 (GAUZE/BANDAGES/DRESSINGS) ×6 IMPLANT
DURAPREP 26ML APPLICATOR (WOUND CARE) ×6 IMPLANT
ELECT REM PT RETURN 9FT ADLT (ELECTROSURGICAL) ×3
ELECTRODE REM PT RTRN 9FT ADLT (ELECTROSURGICAL) ×2 IMPLANT
GAUZE 4X4 16PLY ~~LOC~~+RFID DBL (SPONGE) IMPLANT
GAUZE SPONGE 4X4 12PLY STRL (GAUZE/BANDAGES/DRESSINGS) ×3 IMPLANT
GLOVE EXAM NITRILE XL STR (GLOVE) IMPLANT
GLOVE SRG 8 PF TXTR STRL LF DI (GLOVE) ×4 IMPLANT
GLOVE SURG ENC MOIS LTX SZ8 (GLOVE) ×9 IMPLANT
GLOVE SURG LTX SZ8 (GLOVE) ×6 IMPLANT
GLOVE SURG UNDER POLY LF SZ8 (GLOVE) ×2
GLOVE SURG UNDER POLY LF SZ8.5 (GLOVE) ×9 IMPLANT
GOWN STRL REUS W/ TWL LRG LVL3 (GOWN DISPOSABLE) IMPLANT
GOWN STRL REUS W/ TWL XL LVL3 (GOWN DISPOSABLE) ×6 IMPLANT
GOWN STRL REUS W/TWL 2XL LVL3 (GOWN DISPOSABLE) ×3 IMPLANT
GOWN STRL REUS W/TWL LRG LVL3 (GOWN DISPOSABLE)
GOWN STRL REUS W/TWL XL LVL3 (GOWN DISPOSABLE) ×3
GUIDEWIRE NITINOL BEVEL TIP (WIRE) ×18 IMPLANT
HEMOSTAT POWDER KIT SURGIFOAM (HEMOSTASIS) ×3 IMPLANT
KIT BASIN OR (CUSTOM PROCEDURE TRAY) ×6 IMPLANT
KIT INFUSE X SMALL 1.4CC (Orthopedic Implant) ×3 IMPLANT
KIT POSITION SURG JACKSON T1 (MISCELLANEOUS) ×3 IMPLANT
KIT PULSE EMG NEEDLE (NEUROSURGERY SUPPLIES) IMPLANT
KIT PULSE EMG SURFACE (NEUROSURGERY SUPPLIES) IMPLANT
KIT PULSE MIOM NEEDLE (NEUROSURGERY SUPPLIES) ×3 IMPLANT
KIT PULSE MIOM SURFACE (NEUROSURGERY SUPPLIES) IMPLANT
KIT PULSE XLIF (NEUROSURGERY SUPPLIES) ×3 IMPLANT
KIT SURGICAL ACCESS MAXCESS 4 (KITS) ×3 IMPLANT
KIT TURNOVER KIT B (KITS) ×6 IMPLANT
MARKER SKIN DUAL TIP RULER LAB (MISCELLANEOUS) ×3 IMPLANT
MODULUS XLW 10X22X55MM 10 (Spine Construct) ×3 IMPLANT
NEEDLE HYPO 25X1 1.5 SAFETY (NEEDLE) ×6 IMPLANT
NEEDLE I PASS (NEEDLE) ×3 IMPLANT
NS IRRIG 1000ML POUR BTL (IV SOLUTION) ×3 IMPLANT
OIL CARTRIDGE MAESTRO DRILL (MISCELLANEOUS) ×3
PACK LAMINECTOMY NEURO (CUSTOM PROCEDURE TRAY) ×6 IMPLANT
PAD ARMBOARD 7.5X6 YLW CONV (MISCELLANEOUS) ×9 IMPLANT
PATTIES SURGICAL .5 X.5 (GAUZE/BANDAGES/DRESSINGS) IMPLANT
PATTIES SURGICAL .5 X1 (DISPOSABLE) IMPLANT
PATTIES SURGICAL 1X1 (DISPOSABLE) IMPLANT
PROBE PULSE STIMULATION (NEUROSURGERY SUPPLIES) ×6 IMPLANT
PUTTY BONE ATTRAX 10CC STRIP (Putty) ×3 IMPLANT
ROD RELINE TI MAS 5.5X70MM LD (Rod) ×3 IMPLANT
ROD SPINAL 5.5X80 TI LORDOSE (Rod) ×3 IMPLANT
RUBBER BAND PULSE STRL (MISCELLANEOUS) IMPLANT
SCREW LOCK RELINE 5.5 TULIP (Screw) ×18 IMPLANT
SCREW MAS RELINE 6.5X45 POLY (Screw) ×18 IMPLANT
SPONGE T-LAP 4X18 ~~LOC~~+RFID (SPONGE) IMPLANT
STAPLER SKIN PROX WIDE 3.9 (STAPLE) ×3 IMPLANT
SUT VIC AB 1 CT1 18XBRD ANBCTR (SUTURE) ×6 IMPLANT
SUT VIC AB 1 CT1 8-18 (SUTURE) ×3
SUT VIC AB 2-0 CT1 18 (SUTURE) ×9 IMPLANT
SUT VIC AB 3-0 SH 8-18 (SUTURE) ×18 IMPLANT
SYR TB 1ML 25GX5/8 (SYRINGE) IMPLANT
TOWEL GREEN STERILE (TOWEL DISPOSABLE) ×6 IMPLANT
TOWEL GREEN STERILE FF (TOWEL DISPOSABLE) ×6 IMPLANT
TRAY FOL W/BAG SLVR 16FR STRL (SET/KITS/TRAYS/PACK) ×2 IMPLANT
TRAY FOLEY W/BAG SLVR 16FR LF (SET/KITS/TRAYS/PACK) ×1
WATER STERILE IRR 1000ML POUR (IV SOLUTION) ×6 IMPLANT

## 2021-09-09 NOTE — Brief Op Note (Signed)
09/09/2021  3:42 PM  Christy Romero:  Christy Romero  70 y.o. female  PRE-OPERATIVE DIAGNOSIS:  Lumbar foraminal stenosis, lumbar scoliosis, lumbago, radiculopathy L 34, L 45 levels  POST-OPERATIVE DIAGNOSIS:  Lumbar foraminal stenosis, lumbar scoliosis, lumbago, radiculopathy L 34, L 45 levels  PROCEDURE:  Procedure(s): Right Lumbar three- four Lumbar four-five Anterolateral lumbar interbody fusion with percutaneous pedicle screw fixation (Right) LUMBAR PERCUTANEOUS PEDICLE SCREW two LEVEL (Right) APPLICATION OF PULSE (N/A) APPLICATION OF CELL SAVER (N/A)   SURGEON:  Surgeon(s) and Role:    Maeola Harman, MD - Primary  PHYSICIAN ASSISTANT: Julien Girt, NP  ASSISTANTS: Poteat, RN   ANESTHESIA:   general  EBL:  50 mL   BLOOD ADMINISTERED:none  DRAINS: none   LOCAL MEDICATIONS USED:  MARCAINE    and LIDOCAINE   SPECIMEN:  No Specimen  DISPOSITION OF SPECIMEN:  N/A  COUNTS:  YES  TOURNIQUET:  * No tourniquets in log *  DICTATION: Christy Romero is a 70 year old with 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 It was elected to take Christy Romero to surgery for anterolateral decompression and posterior pedicle screw fixation L 3 - L 5 levels.  Procedure: Christy Romero was brought to the operating room and placed in a left lateral decubitus position on the operative table and using orthogonally projected C-arm fluoroscopy the Christy Romero was placed so that the L 34 and L 45 levels were visualized in AP and lateral plane. The Christy Romero was then taped into position. The table was flexed so as to expose the L 45 level as the Christy Romero has a high iliac crest. Skin was marked along with a posterior finger dissection incision. Christy Romero flank was then prepped and draped in usual sterile fashion and incisions were made sequentially at L 45 and L3-4 levels. Posterior finger dissection was made to enter the retroperitoneal space and then subsequently the probe was inserted into the psoas muscle from the right side  initially at the L 45 level. After mapping the neural elements were able to dock the probe per the midpoint of this vertebral level and without indications electrically of too close proximity to the neural tissues. Subsequently the self-retaining tractor was.after sequential dilators were utilized the shim was employed and the interspace was cleared of psoas muscle and then incised. A thorough discectomy was performed. Angled instruments were used to clear the interspace of disc material.An anterior entry with posterior trajectory was performed to avoid neural elements.   After thorough discectomy was performed and this was performed using AP and lateral fluoroscopy a 10 lordotic by 60 x 22 mm titanium implant was packed with extra small BMP and Attrax. This was tamped into position and its position was confirmed on AP and lateral fluoroscopy. Subsequently exposure was performed at the L3-4 level and similar dissection was performed with locking of the self-retaining retractor. At this level were able to place a 10 lordotic x 22 x 55 mm implant packed in a similar fashion.  Hemostasis was assured the wounds were irrigated and closed with interrupted Vicryl sutures.  Sterile occlusive dressings were placed. Retractor times were:  L 45: 19 minutes;  L 34: 18 minutes.   Christy Romero was then turned into a prone position on the [place] operating table and using AP and lateral fluoroscopy throughout this portion of the procedure with Pulse, pedicle screws were placed using Reline Nuvasive cannulated percutaneous screws. 2 screws were placed at L3 (6.5 x 45), two at L4 of a similar size,  2 at L5 of  a similar size. 70 mm rod was then affixed to the screw heads do a separate stab incision and locked down on the screws on the right and 80 mm rod on the left. All connections were then torqued and the Towers were disassembled. The wounds were irrigated and then closed with 1, 2-0 and 3-0 Vicryl stitches. Sterile occlusive  dressing was placed with Dermabond. Long-acting Marcaine was injected. The Christy Romero was then extubated in the operating room and taken to recovery in stable and satisfactory condition having tolerated Christy Romero operation well. Counts were correct at the end of the case.   PLAN OF CARE: Admit to inpatient   Christy Romero DISPOSITION:  PACU - hemodynamically stable.   Delay start of Pharmacological VTE agent (>24hrs) due to surgical blood loss or risk of bleeding: yes

## 2021-09-09 NOTE — Op Note (Signed)
09/09/2021  3:42 PM  PATIENT:  Christy Romero  70 y.o. female  PRE-OPERATIVE DIAGNOSIS:  Lumbar foraminal stenosis, lumbar scoliosis, lumbago, radiculopathy L 34, L 45 levels  POST-OPERATIVE DIAGNOSIS:  Lumbar foraminal stenosis, lumbar scoliosis, lumbago, radiculopathy L 34, L 45 levels  PROCEDURE:  Procedure(s): Right Lumbar three- four Lumbar four-five Anterolateral lumbar interbody fusion with percutaneous pedicle screw fixation (Right) LUMBAR PERCUTANEOUS PEDICLE SCREW two LEVEL (Right) APPLICATION OF PULSE (N/A) APPLICATION OF CELL SAVER (N/A)   SURGEON:  Surgeon(s) and Role:    Maeola Harman, MD - Primary  PHYSICIAN ASSISTANT: Julien Girt, NP  ASSISTANTS: Poteat, RN   ANESTHESIA:   general  EBL:  50 mL   BLOOD ADMINISTERED:none  DRAINS: none   LOCAL MEDICATIONS USED:  MARCAINE    and LIDOCAINE   SPECIMEN:  No Specimen  DISPOSITION OF SPECIMEN:  N/A  COUNTS:  YES  TOURNIQUET:  * No tourniquets in log *  DICTATION: Patient is a 70 year old with 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 It was elected to take her to surgery for anterolateral decompression and posterior pedicle screw fixation L 3 - L 5 levels.  Procedure: Patient was brought to the operating room and placed in a left lateral decubitus position on the operative table and using orthogonally projected C-arm fluoroscopy the patient was placed so that the L 34 and L 45 levels were visualized in AP and lateral plane. The patient was then taped into position. The table was flexed so as to expose the L 45 level as the patient has a high iliac crest. Skin was marked along with a posterior finger dissection incision. Her flank was then prepped and draped in usual sterile fashion and incisions were made sequentially at L 45 and L3-4 levels. Posterior finger dissection was made to enter the retroperitoneal space and then subsequently the probe was inserted into the psoas muscle from the right side  initially at the L 45 level. After mapping the neural elements were able to dock the probe per the midpoint of this vertebral level and without indications electrically of too close proximity to the neural tissues. Subsequently the self-retaining tractor was.after sequential dilators were utilized the shim was employed and the interspace was cleared of psoas muscle and then incised. A thorough discectomy was performed. Angled instruments were used to clear the interspace of disc material.An anterior entry with posterior trajectory was performed to avoid neural elements.   After thorough discectomy was performed and this was performed using AP and lateral fluoroscopy a 10 lordotic by 60 x 22 mm titanium implant was packed with extra small BMP and Attrax. This was tamped into position and its position was confirmed on AP and lateral fluoroscopy. Subsequently exposure was performed at the L3-4 level and similar dissection was performed with locking of the self-retaining retractor. At this level were able to place a 10 lordotic x 22 x 55 mm implant packed in a similar fashion.  Hemostasis was assured the wounds were irrigated and closed with interrupted Vicryl sutures.  Sterile occlusive dressings were placed. Retractor times were:  L 45: 19 minutes;  L 34: 18 minutes.   Patient was then turned into a prone position on the [place] operating table and using AP and lateral fluoroscopy throughout this portion of the procedure with Pulse, pedicle screws were placed using Reline Nuvasive cannulated percutaneous screws. 2 screws were placed at L3 (6.5 x 45), two at L4 of a similar size,  2 at L5 of  a similar size. 70 mm rod was then affixed to the screw heads do a separate stab incision and locked down on the screws on the right and 80 mm rod on the left. All connections were then torqued and the Towers were disassembled. The wounds were irrigated and then closed with 1, 2-0 and 3-0 Vicryl stitches. Sterile occlusive  dressing was placed with Dermabond. Long-acting Marcaine was injected. The patient was then extubated in the operating room and taken to recovery in stable and satisfactory condition having tolerated her operation well. Counts were correct at the end of the case.   PLAN OF CARE: Admit to inpatient   PATIENT DISPOSITION:  PACU - hemodynamically stable.   Delay start of Pharmacological VTE agent (>24hrs) due to surgical blood loss or risk of bleeding: yes

## 2021-09-09 NOTE — Transfer of Care (Signed)
Immediate Anesthesia Transfer of Care Note  Patient: Coretta Leisey  Procedure(s) Performed: Right Lumbar three- four Lumbar four-five Anterolateral lumbar interbody fusion with percutaneous pedicle screw fixation (Right) LUMBAR PERCUTANEOUS PEDICLE SCREW two LEVEL (Right) APPLICATION OF PULSE APPLICATION OF CELL SAVER  Patient Location: PACU  Anesthesia Type:General  Level of Consciousness: drowsy and patient cooperative  Airway & Oxygen Therapy: Patient Spontanous Breathing and Patient connected to face mask oxygen  Post-op Assessment: Report given to RN, Post -op Vital signs reviewed and stable and Patient moving all extremities X 4  Post vital signs: Reviewed  Last Vitals:  Vitals Value Taken Time  BP 158/72 09/09/21 1528  Temp    Pulse 92 09/09/21 1534  Resp 17 09/09/21 1534  SpO2 98 % 09/09/21 1534  Vitals shown include unvalidated device data.  Last Pain:  Vitals:   09/09/21 0827  TempSrc:   PainSc: 9          Complications: No notable events documented.

## 2021-09-09 NOTE — Anesthesia Procedure Notes (Signed)
Procedure Name: Intubation Date/Time: 09/09/2021 10:48 AM Performed by: Marny Lowenstein, CRNA Pre-anesthesia Checklist: Patient identified, Emergency Drugs available, Suction available and Patient being monitored Patient Re-evaluated:Patient Re-evaluated prior to induction Oxygen Delivery Method: Circle system utilized Preoxygenation: Pre-oxygenation with 100% oxygen Induction Type: IV induction Ventilation: Mask ventilation without difficulty Laryngoscope Size: Miller and 2 Grade View: Grade I Tube type: Oral Number of attempts: 1 Airway Equipment and Method: Stylet and Oral airway Placement Confirmation: ETT inserted through vocal cords under direct vision, positive ETCO2 and breath sounds checked- equal and bilateral Tube secured with: Tape Dental Injury: Teeth and Oropharynx as per pre-operative assessment

## 2021-09-09 NOTE — Interval H&P Note (Signed)
History and Physical Interval Note:  09/09/2021 10:29 AM  Christy Romero  has presented today for surgery, with the diagnosis of Lumbar foraminal stenosis.  The various methods of treatment have been discussed with the patient and family. After consideration of risks, benefits and other options for treatment, the patient has consented to  Procedure(s): Right Lumbar 3-4 Lumbar 4-5 Anterolateral lumbar interbody fusion with percutaneous pedicle screw fixation (Right) LUMBAR PERCUTANEOUS PEDICLE SCREW 2 LEVEL (N/A) APPLICATION OF PULSE (N/A) APPLICATION OF CELL SAVER (N/A) as a surgical intervention.  The patient's history has been reviewed, patient examined, no change in status, stable for surgery.  I have reviewed the patient's chart and labs.  Questions were answered to the patient's satisfaction.     Dorian Heckle

## 2021-09-09 NOTE — Progress Notes (Signed)
Orthopedic Tech Progress Note Patient Details:  Christy Romero 1951-02-08 [id number]  RN stated " patient has brace"   Patient ID: Christy Romero, female   DOB: December 29, 1950, 70 y.o.   MRN: [id number]  Christy Romero 09/09/2021, 5:44 PM

## 2021-09-09 NOTE — H&P (Signed)
Patient ID:   [id number] Patient: Christy Romero  Date of Birth: [birth year] Visit Type: Office Visit   Date: 08/18/2021 11:45 AM Provider: Danae Orleans. Venetia Maxon MD   This 70 year old female presents for back pain.  HISTORY OF PRESENT ILLNESS: 1.  back pain  The patient comes in today to discuss our surgical plan.  She is continuing to complain of severe pain and has 9/10 pain worse in her low back and into the left leg.  She has significant stenosis and foraminal stenosis at the L3-4 and L4-5 levels.  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  I have recommended proceeding with surgery.  This will consist of right-sided XL IF L3-4 and L4-5 with percutaneous pedicle screw fixation.  She wishes to go ahead with surgery.  This is scheduled for 09/09/2021.      Medical/Surgical/Interim History Reviewed, no change.  Last detailed document date:06/23/2021.     PAST MEDICAL HISTORY, SURGICAL HISTORY, FAMILY HISTORY, SOCIAL HISTORY AND REVIEW OF SYSTEMS I have reviewed the patient's past medical, surgical, family and social history as well as the comprehensive review of systems as included on the [facility] history form dated 06/23/2021, which I have signed.  Family History: Reviewed, no changes.  Last detailed document date:06/23/2021.   Social History: Reviewed, no changes. Last detailed document date: 06/23/2021.    MEDICATIONS: (added, continued or stopped this visit) Started Medication Directions Instruction Stopped  alprazolam 0.5 mg tablet     escitalopram 10 mg tablet take 1 tablet by oral route  every day    ibuprofen 800 mg tablet take 1 tablet by oral route 3 times every day with food    lisinopril 20 mg-hydrochlorothiazide 12.5 mg tablet take 1 tablet by oral route  every day    metformin 500 mg tablet take 1 tablet by oral route 2 times every day with morning and evening  meals    omeprazole 20 mg capsule,delayed release take 1 capsule by oral route 2 times every day 30 minutes to 1 hour before a meal    rosuvastatin 5 mg tablet take 1 tablet by oral route  every day      ALLERGIES: Ingredient Reaction Medication Name Comment ACETAMINOPHEN  PERCOCET  OXYCODONE HCL  PERCOCET  LATEX     Reviewed, no changes.    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  PLAN: Right-sided XL IF L3-4 and L4-5 levels with percutaneous pedicle screw fixation.  The patient was given a prescription for an LSO brace.  Risks and benefits were discussed in detail with the patient and she wishes to proceed with surgery.  This is been scheduled for 09/09/2021.  Orders: Instruction(s)/Education: Assessment Instruction I10 Lifestyle education Miscellaneous: Assessment  M43.16 LSO Brace  Completed Orders (this encounter) Order Details Reason Side Interpretation Result Initial Treatment Date Region Lifestyle education Patient will follow up with primary care physician.        Assessment/Plan  # Detail Type Description  1. 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     2. Assessment Lumbar foraminal stenosis (M48.061).     3. 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     4.  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     5. Assessment Osteopenia determined by x-ray (M85.80).     6. 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  Plan Orders LSO Brace.     7. Assessment Essential (primary) hypertension (I10).       Pain Management Plan Pain Scale: 9/10. Method:  Numeric Pain Intensity Scale. Location: back. Onset: 06/23/2021. Duration: varies. Quality: discomforting. Pain management follow-up plan of care: Patient will continue medication management..              Provider:  Danae Orleans. Venetia Maxon MD  08/19/2021 05:04 PM    Dictation edited by: Danae Orleans. Venetia Maxon    CC Providers: Rhea Pink [facility] [address] [place],  [place]  [address]   [facility]  [facility] [address], [address] [place], [place] [address]               Electronically signed by Danae Orleans. Venetia Maxon MD on 08/19/2021 05:04 PM Patient ID:   [id number] Patient: Christy Romero  Date of Birth: 1951/11/13 Visit Type: Office Visit   Date: 07/16/2021 05:00 PM Provider: Danae Orleans. Venetia Maxon MD   This 70 year old female presents for back pain.  HISTORY OF PRESENT ILLNESS: 1.  back pain  I reviewed the patient's DEXA scan with her on the telephone.  This appointment was conducted via telehealth due to the COVID-19 pandemic.  The DEXA scan shows osteoporosis in the forearm but not in the spine.  This was performed on 07/15/2021.  The patient states that she is not a lot better and is still having quite a bit of discomfort.  She is still quite severely limited.  She wants to go ahead with surgery.  She has a follow-up appointment scheduled for 08/18/2021, but I told her we could review her imaging and then I would make a specific surgical recommendation to her.  I have done so and of due to her imaging.  Given her poor bone density any type of implant has some risk but I think that the coronal curvature with right-sided collapse is what needs to be corrected.  I have recommended she undergo right-sided XL IF at the L3-4 and L4-5 levels.  This will be done in conjunction with percutaneous pedicle screw fixation.  While she does have some degenerative changes at the L5-S1 level these are not nearly as severe as the L3-4 stenosis and  right-sided narrowing at the L4-5 level.  We will plan on discussing this with the patient and then will proceed with scheduling surgery as soon as possible.      Medical/Surgical/Interim History Reviewed, no change.  Last detailed document date:06/23/2021.     PAST MEDICAL HISTORY, SURGICAL HISTORY, FAMILY HISTORY, SOCIAL HISTORY AND REVIEW OF SYSTEMS I have reviewed the patient's past medical, surgical, family and social history as well as the comprehensive review of systems as included on the [facility] history form dated 06/23/2021, which I have signed.  Family History: Reviewed, no changes.  Last detailed document date:06/23/2021.   Social History: Reviewed, no changes. Last detailed document date: 06/23/2021.    MEDICATIONS: (added, continued or stopped this visit) Started Medication Directions Instruction Stopped  alprazolam 0.5 mg tablet     escitalopram 10 mg tablet take 1 tablet by oral route  every day    ibuprofen 800 mg tablet take 1 tablet by oral route 3 times every day with food    lisinopril  20 mg-hydrochlorothiazide 12.5 mg tablet take 1 tablet by oral route  every day    metformin 500 mg tablet take 1 tablet by oral route 2 times every day with morning and evening meals    omeprazole 20 mg capsule,delayed release take 1 capsule by oral route 2 times every day 30 minutes to 1 hour before a meal    rosuvastatin 5 mg tablet take 1 tablet by oral route  every day      ALLERGIES: Ingredient Reaction Medication Name Comment ACETAMINOPHEN  PERCOCET  OXYCODONE HCL  PERCOCET  LATEX     Reviewed, no changes.    PHYSICAL EXAM:  Vitals Date Temp F BP Pulse Ht In Wt Lb BMI BSA Pain Score 07/16/2021        8/10     IMPRESSION:  The patient was found of osteoporosis in the forearm and less in the spine.  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  PLAN: The patient is eager to proceed with surgery.  Will set this up in the near future.  Risks and benefits will be discussed with her in detail.   Assessment/Plan  # Detail Type Description  1. 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     2. Assessment Lumbar foraminal stenosis (M48.061).     3. 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     4. 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     5. 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       Pain Management Plan Pain Scale: 8/10. Method: Numeric Pain Intensity Scale. Location: back. Onset: 06/23/2021. Duration: varies. Quality: discomforting. Pain management follow-up plan of care: Patient will continue medication management..              Provider:  Danae Orleans. Venetia Maxon MD  07/17/2021 02:52 PM    Dictation edited by: Danae Orleans. Venetia Maxon    CC Providers: Rhea Pink [facility] [address], [address] [place],  [place]  [address]   [facility]  [facility] [address] [place], [place] [address]               Electronically signed by Danae Orleans. Venetia Maxon MD on 07/17/2021 02:53 PM

## 2021-09-09 NOTE — Anesthesia Postprocedure Evaluation (Signed)
Anesthesia Post Note  Patient: Christy Romero  Procedure(s) Performed: Right Lumbar three- four Lumbar four-five Anterolateral lumbar interbody fusion with percutaneous pedicle screw fixation (Right) LUMBAR PERCUTANEOUS PEDICLE SCREW two LEVEL (Right) APPLICATION OF PULSE APPLICATION OF CELL SAVER     Patient location during evaluation: PACU Anesthesia Type: General Level of consciousness: awake Pain management: pain level controlled Vital Signs Assessment: post-procedure vital signs reviewed and stable Respiratory status: spontaneous breathing, nonlabored ventilation, respiratory function stable and patient connected to nasal cannula oxygen Cardiovascular status: blood pressure returned to baseline and stable Postop Assessment: no apparent nausea or vomiting Anesthetic complications: no   No notable events documented.  Last Vitals:  Vitals:   09/09/21 1705 09/09/21 2030  BP: (!) 144/66 (!) 143/70  Pulse: 97 (!) 101  Resp: 18 16  Temp: 36.8 C 36.8 C  SpO2: 94% 93%    Last Pain:  Vitals:   09/09/21 2030  TempSrc: Oral  PainSc:                  Catheryn Bacon Makinsley Schiavi

## 2021-09-10 ENCOUNTER — Encounter (HOSPITAL_COMMUNITY): Payer: Self-pay | Admitting: Neurosurgery

## 2021-09-10 LAB — GLUCOSE, CAPILLARY: Glucose-Capillary: 107 mg/dL — ABNORMAL HIGH (ref 70–99)

## 2021-09-10 MED ORDER — HYDROCODONE-ACETAMINOPHEN 5-325 MG PO TABS: 1.0000 | ORAL_TABLET | ORAL | 0 refills | Status: AC | PRN

## 2021-09-10 MED ORDER — METHOCARBAMOL 500 MG PO TABS: 500.0000 mg | ORAL_TABLET | Freq: Four times a day (QID) | ORAL | 1 refills | Status: AC

## 2021-09-10 NOTE — Evaluation (Signed)
Physical Therapy Evaluation Patient Details Name: Christy Romero MRN: [id number] DOB: 23-Apr-1951 Today's Date: 09/10/2021  History of Present Illness  Christy Romero  is a 70 yo female who is post Right Lumbar 3-4, 4-5 Anterolateral lumbar interbody fusion. PMHx includes anxiety, arthritis, barretts syndrome, DM, HTN  Clinical Impression  Pt presents to PT with deficits in balance, power, gait, endurance. Pt demonstrates increased sway when ambulating and remains at an increased risk for falls due to unsteadiness. PT encourages pt to utilize a cane for ambulation to improve stability and reduce falls risk. Pt will benefit from continued acute PT services to improve balance and reduce falls risk. PT recommends discharge home with HHPT.       Recommendations for follow up therapy are one component of a multi-disciplinary discharge planning process, led by the attending physician.  Recommendations may be updated based on patient status, additional functional criteria and insurance authorization.  Follow Up Recommendations Home health PT    Equipment Recommendations   (pt preferring to purchase can outpatient)    Recommendations for Other Services       Precautions / Restrictions Precautions Precautions: Fall;Back Precaution Booklet Issued: Yes (comment) Required Braces or Orthoses: Spinal Brace Spinal Brace: Lumbar corset;Applied in sitting position Restrictions Weight Bearing Restrictions: No      Mobility  Bed Mobility Overal bed mobility: Needs Assistance Bed Mobility: Rolling;Sidelying to Sit Rolling: Supervision Sidelying to sit: Min guard       General bed mobility comments: pt received and left sitting at edge of bed, able to verbalize log roll    Transfers Overall transfer level: Needs assistance Equipment used: None Transfers: Sit to/from Stand Sit to Stand: Supervision            Ambulation/Gait Ambulation/Gait assistance: Supervision Gait Distance (Feet):  150 Feet Assistive device: None Gait Pattern/deviations: Step-through pattern Gait velocity: functional Gait velocity interpretation: 1.31 - 2.62 ft/sec, indicative of limited community ambulator General Gait Details: pt with slowed step-through gait, one mild loss of balance, pt demonstrates increased lateral sway  Stairs Stairs: Yes Stairs assistance: Supervision Stair Management: One rail Right;Step to pattern Number of Stairs: 10    Wheelchair Mobility    Modified Rankin (Stroke Patients Only)       Balance Overall balance assessment: Needs assistance Sitting-balance support: No upper extremity supported;Feet supported Sitting balance-Leahy Scale: Good     Standing balance support: No upper extremity supported Standing balance-Leahy Scale: Fair                               Pertinent Vitals/Pain Pain Assessment: 0-10 Pain Score: 5  Pain Location: back Pain Descriptors / Indicators: Aching Pain Intervention(s): Monitored during session    Home Living Family/patient expects to be discharged to:: Private residence Living Arrangements: Alone Available Help at Discharge: Friend(s);Available 24 hours/day Type of Home: House Home Access: Stairs to enter Entrance Stairs-Rails: Right Entrance Stairs-Number of Steps: 3 STE in the front, 7 STE in the back Home Layout: Two level;Full bath on main level Home Equipment: None Additional Comments: pt's friend is planning to stay with pt for as long as needed at d/c    Prior Function Level of Independence: Independent               Hand Dominance        Extremity/Trunk Assessment   Upper Extremity Assessment Upper Extremity Assessment: Overall WFL for tasks assessed    Lower Extremity  Assessment Lower Extremity Assessment: RLE deficits/detail RLE Deficits / Details: R ankle DF weakness, pt unable to dorsiflex R ankle in standing, does not demonstrate significant foot drag during swing phase     Cervical / Trunk Assessment Cervical / Trunk Assessment: Other exceptions Cervical / Trunk Exceptions: s/p back sx  Communication   Communication: No difficulties  Cognition Arousal/Alertness: Awake/alert Behavior During Therapy: WFL for tasks assessed/performed Overall Cognitive Status: Within Functional Limits for tasks assessed                                 General Comments: demonstrated great understanding of back precautions      General Comments General comments (skin integrity, edema, etc.): VSS on RA    Exercises     Assessment/Plan    PT Assessment Patient needs continued PT services  PT Problem List Decreased activity tolerance;Decreased balance;Decreased mobility       PT Treatment Interventions DME instruction;Gait training;Stair training;Functional mobility training;Therapeutic activities;Therapeutic exercise;Balance training;Neuromuscular re-education;Patient/family education    PT Goals (Current goals can be found in the Care Plan section)  Acute Rehab PT Goals Patient Stated Goal: to go home PT Goal Formulation: With patient Time For Goal Achievement: 09/24/21 Potential to Achieve Goals: Good    Frequency Min 5X/week   Barriers to discharge        Co-evaluation               AM-PAC PT "6 Clicks" Mobility  Outcome Measure Help needed turning from your back to your side while in a flat bed without using bedrails?: A Little Help needed moving from lying on your back to sitting on the side of a flat bed without using bedrails?: A Little Help needed moving to and from a bed to a chair (including a wheelchair)?: A Little Help needed standing up from a chair using your arms (e.g., wheelchair or bedside chair)?: A Little Help needed to walk in hospital room?: A Little Help needed climbing 3-5 steps with a railing? : A Little 6 Click Score: 18    End of Session Equipment Utilized During Treatment: Back brace Activity Tolerance:  Patient tolerated treatment well Patient left: in bed;with call bell/phone within reach Nurse Communication: Mobility status PT Visit Diagnosis: Unsteadiness on feet (R26.81);Other abnormalities of gait and mobility (R26.89)    Time: 1856-3149 PT Time Calculation (min) (ACUTE ONLY): 15 min   Charges:   PT Evaluation $PT Eval Low Complexity: 1 Low          Arlyss Gandy, PT, DPT Acute Rehabilitation Pager: [phone]   Arlyss Gandy 09/10/2021, 10:23 AM

## 2021-09-10 NOTE — Plan of Care (Signed)
Pt doing well. Pt given D/C instructions with verbal understanding. Rx's were sent to the pharmacy by MD. Pt's incision is clean and dry with no sign of infection. Pt's IV was removed prior to D/C. Pt D/C'd home via wheelchair per MD order. Home Health was arranged by [facility] per MD order. Pt is stable @ D/C and has no other needs at this time. Rema Fendt, RN

## 2021-09-10 NOTE — Discharge Instructions (Signed)

## 2021-09-10 NOTE — Evaluation (Signed)
Occupational Therapy Evaluation Patient Details Name: Christy Romero MRN: [id number] DOB: 06-17-1951 Today's Date: 09/10/2021   History of Present Illness Christy Romero  is a 70 yo female who is post Right Lumbar 3-4, 4-5 Anterolateral lumbar interbody fusion. PMHx includes anxiety, arthritis, barretts syndrome, DM, HTN   Clinical Impression   Christy Romero was indep prior to the above back sx; she lives at home alone with 3 STE and her main bedroom is on the second level. Her friend plans to stay with her at d/c and assist as needed. Upon evaluation pt verbalized and demonstrated understanding of all back precautions; and log rolled from sup>sit without physical assist. Pt was noted with an unsteady gait due to the inability to dorsiflex her R foot. Recommend RW or AD use at dc for decreased fall risk. Recommend HHOT.    Recommendations for follow up therapy are one component of a multi-disciplinary discharge planning process, led by the attending physician.  Recommendations may be updated based on patient status, additional functional criteria and insurance authorization.   Follow Up Recommendations  Home health OT;Supervision/Assistance - 24 hour    Equipment Recommendations   (RW)       Precautions / Restrictions Restrictions Weight Bearing Restrictions: No      Mobility Bed Mobility Overal bed mobility: Needs Assistance Bed Mobility: Rolling;Sidelying to Sit Rolling: Supervision Sidelying to sit: Min guard       General bed mobility comments: no physical assist required; bed flat with rails down    Transfers Overall transfer level: Needs assistance   Transfers: Sit to/from Stand Sit to Stand: Supervision              Balance Overall balance assessment: Needs assistance Sitting-balance support: Feet supported Sitting balance-Leahy Scale: Good     Standing balance support: Single extremity supported Standing balance-Leahy Scale: Fair                              ADL either performed or assessed with clinical judgement   ADL Overall ADL's : Needs assistance/impaired Eating/Feeding: Independent;Sitting   Grooming: Min guard;Standing;Cueing for compensatory techniques Grooming Details (indicate cue type and reason): pt completed oral hygiene at the sink Upper Body Bathing: Set up;Sitting   Lower Body Bathing: Minimal assistance;Sit to/from stand   Upper Body Dressing : Set up;Sitting Upper Body Dressing Details (indicate cue type and reason): pt demonstrated great ability to don back brace Lower Body Dressing: Moderate assistance;Sit to/from stand   Toilet Transfer: Min guard;Ambulation   Toileting- Clothing Manipulation and Hygiene: Supervision/safety;Sitting/lateral lean       Functional mobility during ADLs: Min guard;Cueing for safety General ADL Comments: pt completed functional mobility without RW this session; noted unsteady gait and recommended RW for incrsed safetyand decreased fall risk. verbal cues throughout fro compensaotry techniques     Vision Baseline Vision/History: 1 Wears glasses Ability to See in Adequate Light: 0 Adequate Patient Visual Report: No change from baseline Vision Assessment?: No apparent visual deficits     Perception Perception Perception Tested?: No   Praxis      Pertinent Vitals/Pain Pain Assessment: 0-10 Pain Score: 5  Pain Location: back Pain Descriptors / Indicators: Discomfort;Grimacing;Guarding Pain Intervention(s): Monitored during session;Limited activity within patient's tolerance     Hand Dominance     Extremity/Trunk Assessment Upper Extremity Assessment Upper Extremity Assessment: Overall WFL for tasks assessed   Lower Extremity Assessment Lower Extremity Assessment: Defer to PT evaluation (Noted pt  unable to dorsiflex R foot in standing)   Cervical / Trunk Assessment Cervical / Trunk Assessment: Normal;Other exceptions Cervical / Trunk Exceptions: s/p back sx    Communication Communication Communication: No difficulties   Cognition Arousal/Alertness: Awake/alert Behavior During Therapy: WFL for tasks assessed/performed Overall Cognitive Status: Within Functional Limits for tasks assessed                                 General Comments: demonstrated great understanding of back precautions   General Comments  VSS on RA; PA with pt at the end of the session    Exercises     Shoulder Instructions      Home Living Family/patient expects to be discharged to:: Private residence Living Arrangements: Alone Available Help at Discharge: Friend(s);Available 24 hours/day Type of Home: House Home Access: Stairs to enter [employer] of Steps: 3 STE in the front, 7 STE in the back   Home Layout: Two level;Full bath on main level (bed upstairs, able to sleep downstairs on couch/recliner if needed) Alternate Level Stairs-Number of Steps: flight   Bathroom Shower/Tub: Tub/shower unit;Curtain   Bathroom Toilet: Standard     Home Equipment: None (back brace)   Additional Comments: pt's friend is planning to stay with pt for as long as needed at d/c      Prior Functioning/Environment Level of Independence: Independent                 OT Problem List: Decreased strength;Decreased range of motion;Decreased activity tolerance;Impaired balance (sitting and/or standing);Decreased knowledge of use of DME or AE;Decreased knowledge of precautions;Pain      OT Treatment/Interventions:      OT Goals(Current goals can be found in the care plan section) Acute Rehab OT Goals Patient Stated Goal: less pain OT Goal Formulation: With patient  OT Frequency:     Barriers to D/C:            Co-evaluation              AM-PAC OT "6 Clicks" Daily Activity     Outcome Measure Help from another person eating meals?: None Help from another person taking care of personal grooming?: A Little Help from another person  toileting, which includes using toliet, bedpan, or urinal?: A Little Help from another person bathing (including washing, rinsing, drying)?: A Little Help from another person to put on and taking off regular upper body clothing?: None Help from another person to put on and taking off regular lower body clothing?: A Little 6 Click Score: 20   End of Session Equipment Utilized During Treatment: Back brace Nurse Communication: Mobility status;Precautions  Activity Tolerance: Patient tolerated treatment well Patient left: in bed;with call bell/phone within reach (PA in the room)  OT Visit Diagnosis: Unsteadiness on feet (R26.81);Other abnormalities of gait and mobility (R26.89);Pain                Time: 4696-2952 OT Time Calculation (min): 22 min Charges:  OT General Charges $OT Visit: 1 Visit OT Evaluation $OT Eval Moderate Complexity: 1 Mod   Christy Romero 09/10/2021, 8:46 AM

## 2021-09-10 NOTE — TOC Transition Note (Addendum)
Transition of Care [facility]) - CM/SW Discharge Note   Patient Details  Name: Christy Romero MRN: [id number] Date of Birth: September 16, 1951  [facility]) CM/SW Contact:  Beckie Busing, RN Phone Number:[phone]  09/10/2021, 12:12 PM   Clinical Narrative:    [facility] consulted for patient with [facility] needs. Cm at bedside to offer [facility] choice. HH has been set up with [facility]. Info has been faxed per request. Info has been added to avs. No other needs noted at this time. TOC wil sign off.      Barriers to Discharge: No Barriers Identified   Patient Goals and CMS Choice   CMS Medicare.gov Compare Post Acute Care list provided to:: Patient Choice offered to / list presented to : Patient  Discharge Placement                       Discharge Plan and Services                DME Arranged: N/A DME Agency: NA       HH Arranged: PT, OT HH Agency: Other - See comment ([facility]) Date HH Agency Contacted: 09/10/21 Time HH Agency Contacted: 1212 Representative spoke with at [facility] Agency: Leah  Social Determinants of Health (SDOH) Interventions     Readmission Risk Interventions No flowsheet data found.

## 2021-09-10 NOTE — Discharge Summary (Signed)
Physician Discharge Summary  Patient ID: Christy Romero MRN: [id number] DOB/AGE: 1951/04/02 70 y.o.  Admit date: 09/09/2021 Discharge date: 09/10/2021  Admission Diagnoses: Lumbar foraminal stenosis, lumbar scoliosis, lumbago, radiculopathy L 34, L 45 levels  Discharge Diagnoses: Lumbar foraminal stenosis, lumbar scoliosis, lumbago, radiculopathy L 34, L 45 levels Active Problems:   Lumbar scoliosis   Discharged Condition: good  Hospital Course: The patient was admitted on 09/09/2021 and taken to the operating room where the patient underwent right-sided XL IF L3-4 and L4-5 with percutaneous pedicle screw fixation. The patient tolerated the procedure well and was taken to the recovery room and then to the floor in stable condition. The hospital course was routine. There were no complications. The wound remained clean dry and intact. Pt had appropriate back soreness. No complaints of leg pain or new N/T/W. The patient remained afebrile with stable vital signs, and tolerated a regular diet. The patient continued to increase activities, and pain was well controlled with oral pain medications.   Consults: None  Significant Diagnostic Studies: radiology: X-Ray: intraoperative  Treatments: surgery:  Right Lumbar three- four Lumbar four-five Anterolateral lumbar interbody fusion with percutaneous pedicle screw fixation (Right) LUMBAR PERCUTANEOUS PEDICLE SCREW two LEVEL (Right) APPLICATION OF PULSE (N/A) APPLICATION OF CELL SAVER (N/A)    Discharge Exam: Blood pressure (!) 110/46, pulse 89, temperature 98.5 F (36.9 C), temperature source Oral, resp. rate 16, height 5\' 6"  (1.676 m), weight 70.3 kg, SpO2 96 %.  Physical Exam: Patient is awake, A/O X 4, conversant, and in good spirits. They are in NAD and VSS. Doing well. Speech is fluent and appropriate. MAEW with good strength. Sensation to light touch is intact. PERLA, EOMI. CNs grossly intact. Dressings are clean dry intact. Incisions are  well approximated with no drainage, erythema, or edema. LSO brace in place  Disposition: Discharge disposition: 01-Home or Self Care        Allergies as of 09/10/2021       Reactions   Percocet [oxycodone-acetaminophen]    Hallucinations   Latex Rash   With repeated use        Medication List     STOP taking these medications    diclofenac Sodium 1 % Gel Commonly known as: VOLTAREN   ibuprofen 800 MG tablet Commonly known as: ADVIL       TAKE these medications    Alpha-Lipoic Acid 200 MG Tabs Take 200 mg by mouth daily.   ALPRAZolam 0.5 MG tablet Commonly known as: XANAX Take 0.5 mg by mouth at bedtime.   escitalopram 10 MG tablet Commonly known as: LEXAPRO Take 15 mg by mouth daily.   Fish Oil Ultra 1400 MG Caps Take 2,800 mg by mouth at bedtime.   HYDROcodone-acetaminophen 5-325 MG tablet Commonly known as: NORCO/VICODIN Take 1-2 tablets by mouth every 4 (four) hours as needed for moderate pain or severe pain ((score 4 to 6)). What changed:  how much to take when to take this reasons to take this   lisinopril-hydrochlorothiazide 20-12.5 MG tablet Commonly known as: ZESTORETIC Take 1 tablet by mouth daily.   Magnesium 250 MG Tabs Take 250 mg by mouth daily.   metFORMIN 500 MG 24 hr tablet Commonly known as: GLUCOPHAGE-XR Take 500 mg by mouth 2 (two) times daily.   methocarbamol 500 MG tablet Commonly known as: Robaxin Take 1 tablet (500 mg total) by mouth 4 (four) times daily.   omeprazole 20 MG capsule Commonly known as: PRILOSEC Take 20 mg by mouth 2 (two) times  daily.   rosuvastatin 5 MG tablet Commonly known as: CRESTOR Take 5 mg by mouth daily.   vitamin B-12 1000 MCG tablet Commonly known as: CYANOCOBALAMIN Take 2,000 mcg by mouth daily.   Vitamin D 50 MCG (2000 UT) Caps Take 2,000 Units by mouth daily.         Signed: Council Mechanic, DNP, NP-C 09/10/2021, 8:51 AM

## 2021-09-10 NOTE — Progress Notes (Signed)
Subjective: Patient reports that she is doing well overall. She does report appropriate incisional pain and right psoas muscle irritation. She has had a decrease in her LLE pain but  is unable to tell at this time if her low back pain has improved. No acute events overnight.  Objective: Vital signs in last 24 hours: Temp:  [97.8 F (36.6 C)-98.9 F (37.2 C)] 98.5 F (36.9 C) (09/14 0733) Pulse Rate:  [79-104] 89 (09/14 0733) Resp:  [10-20] 16 (09/14 0733) BP: (110-179)/(46-80) 110/46 (09/14 0733) SpO2:  [92 %-100 %] 96 % (09/14 0733) Weight:  [70.3 kg] 70.3 kg (09/13 0821)  Intake/Output from previous day: 09/13 0701 - 09/14 0700 In: 2000 [I.V.:1900; IV Piggyback:100] Out: 1000 [Urine:950; Blood:50] Intake/Output this shift: No intake/output data recorded.  Physical Exam: Patient is awake, A/O X 4, conversant, and in good spirits. They are in NAD and VSS. Doing well. Speech is fluent and appropriate. MAEW with good strength. Sensation to light touch is intact. PERLA, EOMI. CNs grossly intact. Dressings are clean dry intact. Incisions are well approximated with no drainage, erythema, or edema. LSO brace in place   Lab Results: No results for input(s): WBC, HGB, HCT, PLT in the last 72 hours. BMET No results for input(s): NA, K, CL, CO2, GLUCOSE, BUN, CREATININE, CALCIUM in the last 72 hours.  Studies/Results: DG Lumbar Spine 2-3 Views  Result Date: 09/09/2021 CLINICAL DATA:  Right L3-4 and L4-5 anterior lumbar interbody fusion EXAM: LUMBAR SPINE - 2-3 VIEW COMPARISON:  MRI 04/15/2021 FINDINGS: Multiple C-arm images show diskectomy and interbody spacers at L3-4 and L4-5 with subsequent placement of bilateral pedicle screws and posterior rods. Components appear well positioned in there is no radiographically detectable complication. IMPRESSION: Discectomy and fusion procedure from L3 through L5. Electronically Signed   By: Paulina Fusi M.D.   On: 09/09/2021 16:09   DG C-Arm 1-60 Min-No  Report  Result Date: 09/09/2021 Fluoroscopy was utilized by the requesting physician.  No radiographic interpretation.    Assessment/Plan: Patient is post-op day 1 s/p right-sided XLIF L3-4 and L4-5 with percutaneous pedicle screw fixation. She is recovering well and reports a significant reduction in her preoperative symptoms.  Her only complaint is mild incisional and right hip discomfort.  She has ambulated with nursing staff and is awaiting PT/OT evaluation.  Continue LSO brace when OOB. Continue working on pain control, mobility and ambulating patient. Will plan for discharge today.    LOS: 1 day     Council Mechanic, DNP, NP-C 09/10/2021, 8:15 AM

## 2021-09-11 NOTE — Progress Notes (Signed)
CM received call from patient stating that she had called a [facility] and they did not have her information of file. CM explained to patient that CM has set up home health with [facility] and information has been faxed. HH agency has agreed that they are able to accept patient and address was verified. CM also made patient aware that their is more that one [facility] and that [phone] number was on ans summary and that this is the agency that will service the patient.  CM has also faxed the signed d/c summary to [facility] [phone]

## 2021-09-17 MED FILL — Heparin Sodium (Porcine) Inj 1000 Unit/ML: INTRAMUSCULAR | Qty: 30 | Status: AC

## 2021-09-17 MED FILL — Sodium Chloride IV Soln 0.9%: INTRAVENOUS | Qty: 1000 | Status: AC

## 2022-05-06 ENCOUNTER — Other Ambulatory Visit (HOSPITAL_COMMUNITY): Payer: Self-pay | Admitting: Neurosurgery

## 2022-05-06 ENCOUNTER — Other Ambulatory Visit: Payer: Self-pay | Admitting: Neurosurgery

## 2022-05-06 DIAGNOSIS — M5416 Radiculopathy, lumbar region: Secondary | ICD-10-CM

## 2022-05-22 ENCOUNTER — Ambulatory Visit (HOSPITAL_COMMUNITY)
Admission: RE | Admit: 2022-05-22 | Discharge: 2022-05-22 | Disposition: A | Discharge status: Home / Self Care | Payer: Medicare Other | Source: Ambulatory Visit | Attending: Neurosurgery | Admitting: Neurosurgery

## 2022-05-22 DIAGNOSIS — M5416 Radiculopathy, lumbar region: Secondary | ICD-10-CM | POA: Diagnosis present

## 2022-05-22 IMAGING — MR MR LUMBAR SPINE WO/W CM
6 of 7 series · 31 of 48 positions shown · IV contrast (gadavist)
Comparison: [DATE]

CLINICAL DATA: Low back pain for 4 years. Right foot drop. Left
lower leg numbness

EXAM:
MRI LUMBAR SPINE WITHOUT AND WITH CONTRAST
TECHNIQUE: Multiplanar and multiecho pulse sequences of the lumbar spine were
obtained without and with intravenous contrast.
CONTRAST:  7mL GADAVIST GADOBUTROL 1 MMOL/ML IV SOLN

[Series 5: T1 · sagittal · 4.0mm · 0.81mm/px · 5 of 15 slices shown (1 of 2)]
[im 1/15]
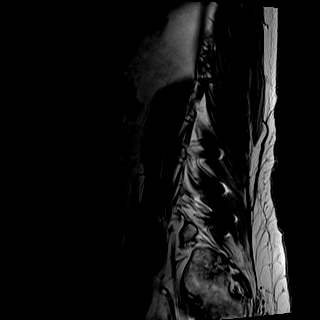
[im 4/15]
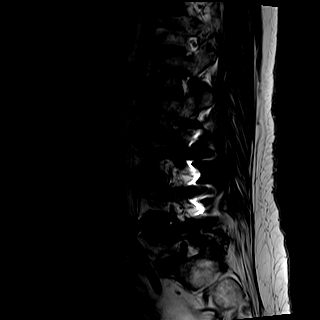
[im 8/15]
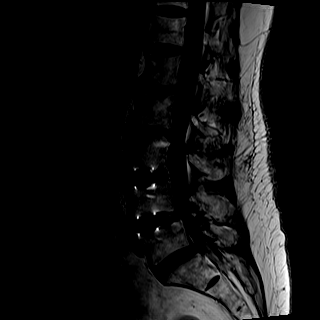
[im 11/15]
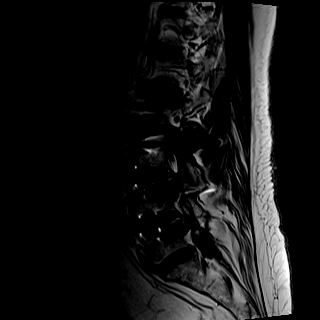
[im 15/15]
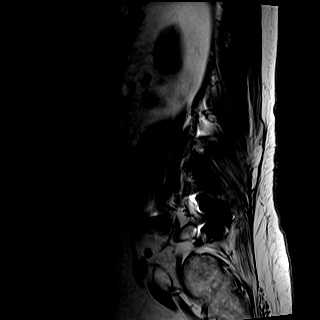

[Series 6: STIR · sagittal · 4.0mm · 0.51mm/px · 2 of 15 slices shown]
[im 1/15]
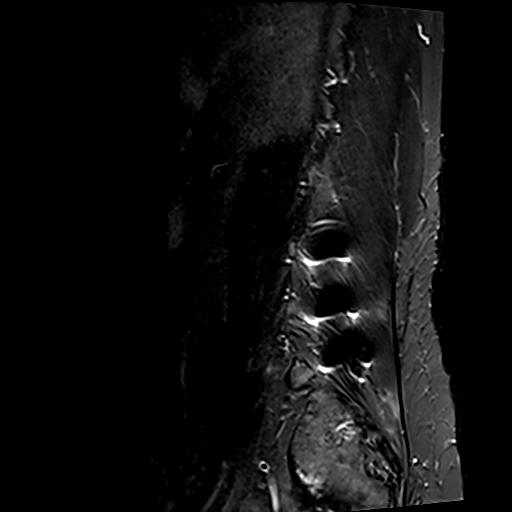
[im 4/15]
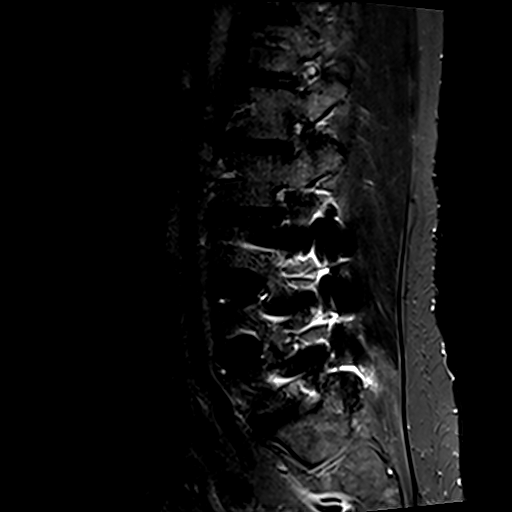

[Series 7: T2 · axial · 4.0mm · 0.70mm/px · z∈[-142,+70]mm · 8 of 35 slices shown (1 of 2)]
[im 1/35]
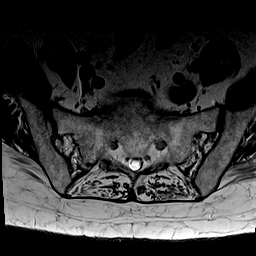
[im 4/35]
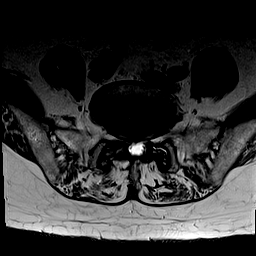
[im 12/35]
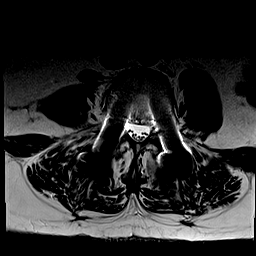
[im 16/35]
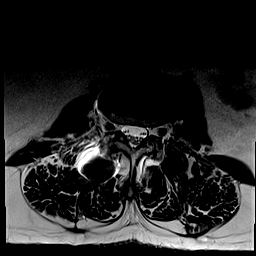
[im 19/35]
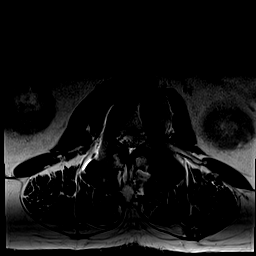
[im 23/35]
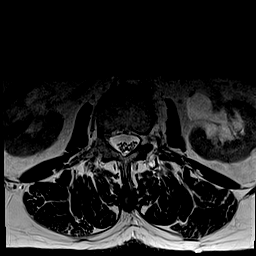
[im 31/35]
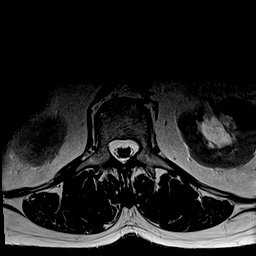
[im 35/35]
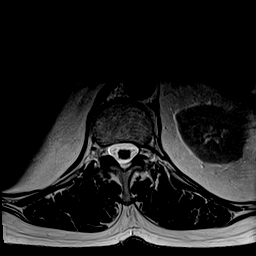

[Series 8: T1 · axial · 4.0mm · 0.35mm/px · z∈[-142,+70]mm · 8 of 35 slices shown (2 of 2)]
[im 1/35]
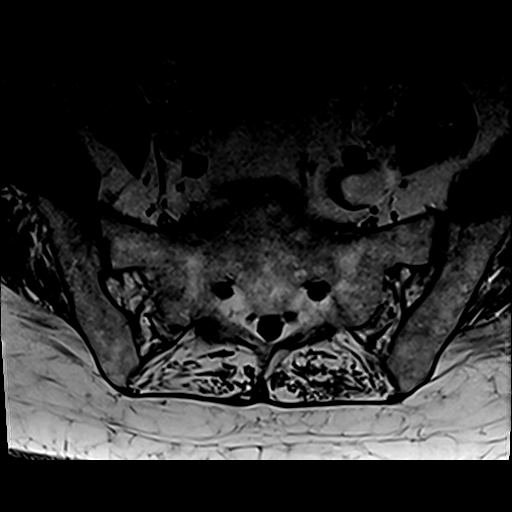
[im 4/35]
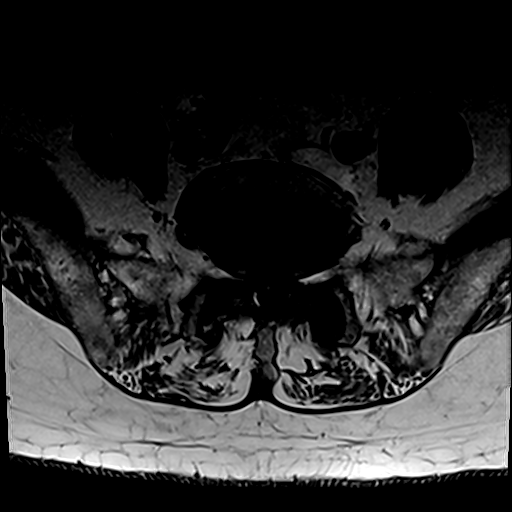
[im 12/35]
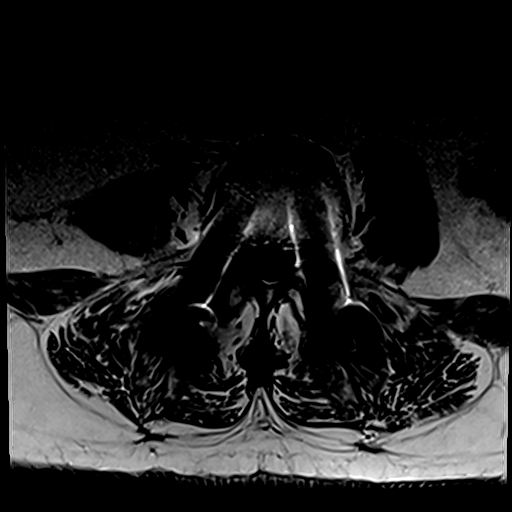
[im 16/35]
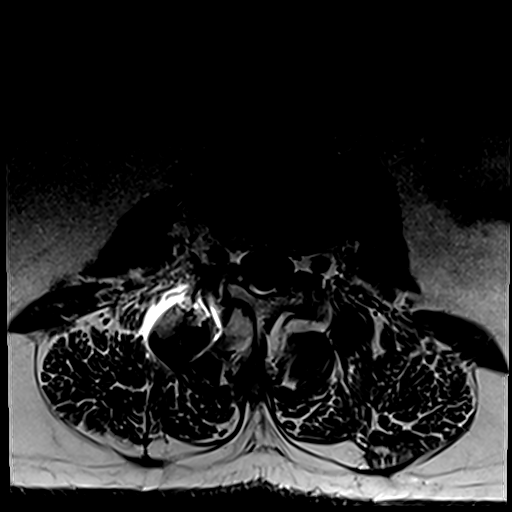
[im 19/35]
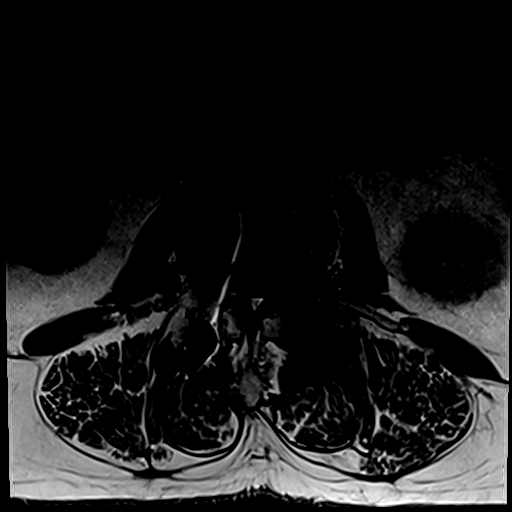
[im 23/35]
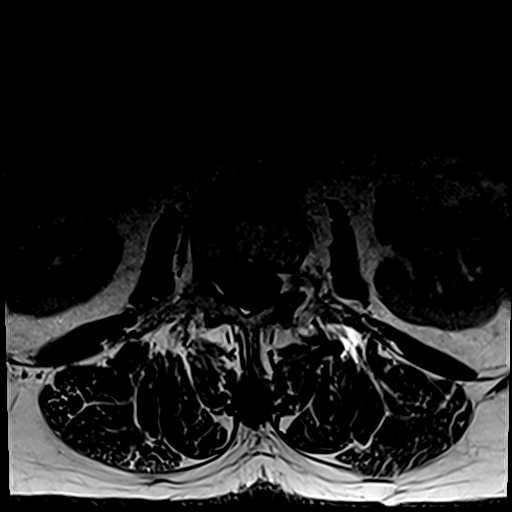
[im 31/35]
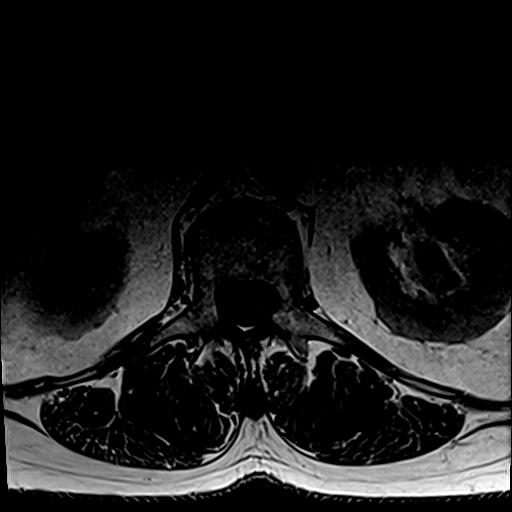
[im 35/35]
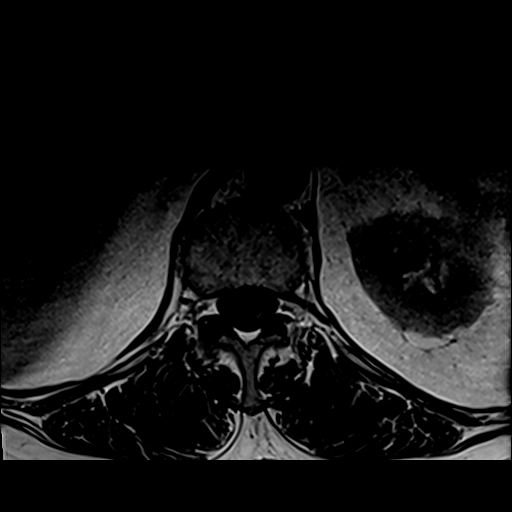

[Series 9: T2 · sagittal · 4.0mm · 0.68mm/px · 4 of 15 slices shown (2 of 2)]
[im 1/15]
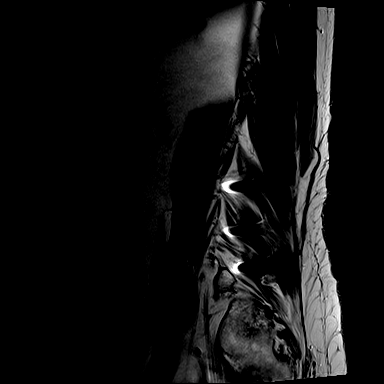
[im 5/15]
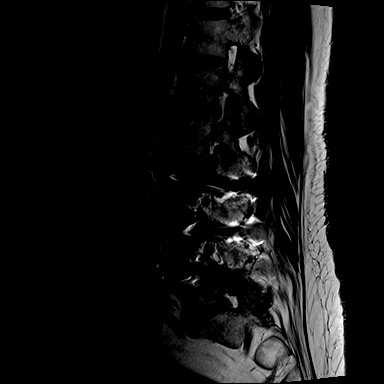
[im 10/15]
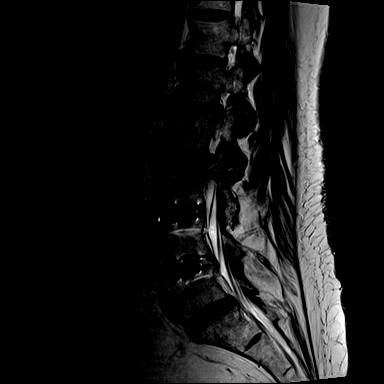
[im 15/15]
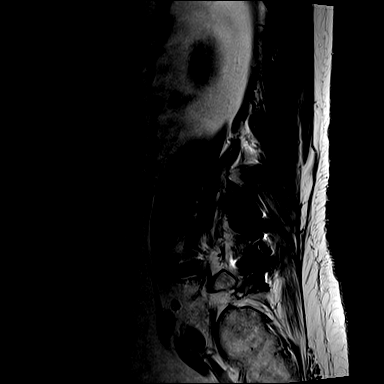

[Series 10: T1 fat-sat post-contrast · sagittal · 4.0mm · 0.81mm/px · 4 of 15 slices shown]
[im 1/15]
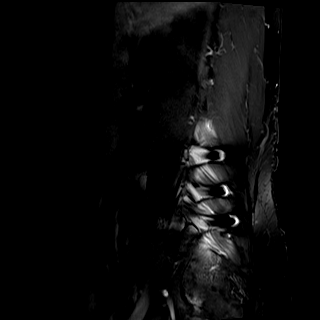
[im 5/15]
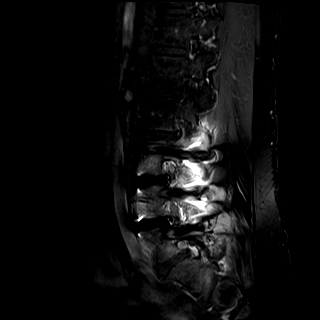
[im 10/15]
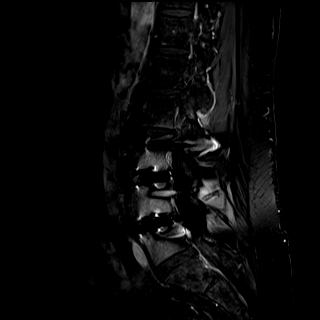
[im 15/15]
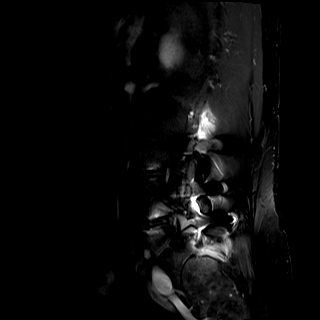

[31 of 48 positions shown; findings below may reference images not displayed]

FINDINGS: Segmentation:  5 lumbar type vertebrae

Alignment:  Mild scoliotic curvature.  Mild L5-S1 anterolisthesis.

Vertebrae: No fracture, evidence of discitis, or bone lesion.
Interval L3-4 and L4-5 PLIF with solid arthrodesis appearance

Conus medullaris and cauda equina: Conus extends to the L2 level.
Conus and cauda equina appear normal.

Paraspinal and other soft tissues: Left renal hilar cysts.

Disc levels:

L1-L2: Disc narrowing and circumferential bulging with mild facet
spurring. Mild left foraminal and subarticular recess narrowing

L2-L3: Disc narrowing and bulging. Facet spurring and ligamentum
flavum thickening. Progressed, moderate spinal stenosis.
Mild-to-moderate right foraminal narrowing

L3-L4: PLIF.  No recurrent impingement

L4-L5: PLIF.  No recurrent impingement

L5-S1:Degenerative facet spurring which is asymmetric and bulky on
the right. Central disc protrusion. Asymmetric right subarticular
recess stenosis, either S1 nerve root could be affected. Mild right
foraminal narrowing.
IMPRESSION: 1. Interval L3-4 and L4-5 PLIF with solid arthrodesis and no
residual impingement.
2. L2-3 adjacent segment degeneration with progressive and now
moderate spinal stenosis. Mild-to-moderate right foraminal
narrowing.
3. L5-S1 advanced facet osteoarthritis with chronic anterolisthesis.
Either S1 nerve root could be affected in the subarticular recesses.

## 2022-05-22 MED ORDER — GADOBUTROL 1 MMOL/ML IV SOLN
7.0000 mL | Freq: Once | INTRAVENOUS | Status: AC | PRN
  Administered 2022-05-22: 7 mL via INTRAVENOUS
